# Patient Record
Sex: Female | Born: 1980 | Hispanic: No | Marital: Married | Smoking: Former smoker
Health system: Southern US, Community
[De-identification: ages and names within clinical notes are randomized; demographics above are authoritative.]

## PROBLEM LIST (undated history)

## (undated) DIAGNOSIS — N200 Calculus of kidney: Secondary | ICD-10-CM

## (undated) DIAGNOSIS — Z8619 Personal history of other infectious and parasitic diseases: Secondary | ICD-10-CM

## (undated) HISTORY — DX: Personal history of other infectious and parasitic diseases: Z86.19

## (undated) HISTORY — PX: CHOLECYSTECTOMY: SHX55

---

## 2008-04-15 ENCOUNTER — Encounter: Payer: Self-pay | Admitting: Physician Assistant

## 2008-04-15 ENCOUNTER — Ambulatory Visit: Payer: Self-pay | Admitting: Obstetrics & Gynecology

## 2008-04-15 LAB — CONVERTED CEMR LAB
Basophils Absolute: 0 10*3/uL (ref 0.0–0.1)
Basophils Relative: 0 % (ref 0–1)
Eosinophils Relative: 0 % (ref 0–5)
HCT: 33.8 % — ABNORMAL LOW (ref 36.0–46.0)
Hepatitis B Surface Ag: NEGATIVE
Hgb F Quant: 0 % (ref 0.0–2.0)
Hgb S Quant: 0 % (ref 0.0–0.0)
Lymphs Abs: 2.4 10*3/uL (ref 0.7–4.0)
MCHC: 31.1 g/dL (ref 30.0–36.0)
Monocytes Relative: 8 % (ref 3–12)
Neutro Abs: 9.1 10*3/uL — ABNORMAL HIGH (ref 1.7–7.7)
WBC: 12.6 10*3/uL — ABNORMAL HIGH (ref 4.0–10.5)

## 2008-04-20 ENCOUNTER — Ambulatory Visit (HOSPITAL_COMMUNITY): Admission: RE | Admit: 2008-04-20 | Discharge: 2008-04-20 | Payer: Self-pay | Admitting: Family Medicine

## 2008-04-29 ENCOUNTER — Ambulatory Visit: Payer: Self-pay | Admitting: Obstetrics & Gynecology

## 2008-04-30 ENCOUNTER — Encounter: Payer: Self-pay | Admitting: Family

## 2008-05-13 ENCOUNTER — Ambulatory Visit: Payer: Self-pay | Admitting: Family Medicine

## 2008-05-13 ENCOUNTER — Ambulatory Visit (HOSPITAL_COMMUNITY): Admission: RE | Admit: 2008-05-13 | Discharge: 2008-05-13 | Payer: Self-pay | Admitting: Obstetrics & Gynecology

## 2008-05-13 ENCOUNTER — Encounter: Payer: Self-pay | Admitting: Family

## 2008-05-20 ENCOUNTER — Ambulatory Visit: Payer: Self-pay | Admitting: Family Medicine

## 2008-05-27 ENCOUNTER — Ambulatory Visit: Payer: Self-pay | Admitting: Family Medicine

## 2008-05-27 ENCOUNTER — Encounter: Payer: Self-pay | Admitting: Family

## 2008-06-03 ENCOUNTER — Ambulatory Visit: Payer: Self-pay | Admitting: Family Medicine

## 2008-06-17 ENCOUNTER — Inpatient Hospital Stay (HOSPITAL_COMMUNITY): Admission: AD | Admit: 2008-06-17 | Discharge: 2008-06-18 | Payer: Self-pay | Admitting: Obstetrics & Gynecology

## 2008-06-17 ENCOUNTER — Ambulatory Visit: Payer: Self-pay | Admitting: Family Medicine

## 2008-10-16 ENCOUNTER — Emergency Department (HOSPITAL_COMMUNITY): Admission: EM | Admit: 2008-10-16 | Discharge: 2008-10-16 | Payer: Self-pay | Admitting: Emergency Medicine

## 2009-09-13 ENCOUNTER — Ambulatory Visit (HOSPITAL_COMMUNITY): Admission: RE | Admit: 2009-09-13 | Discharge: 2009-09-13 | Payer: Self-pay | Admitting: Internal Medicine

## 2009-10-28 ENCOUNTER — Encounter (INDEPENDENT_AMBULATORY_CARE_PROVIDER_SITE_OTHER): Payer: Self-pay | Admitting: Surgery

## 2009-10-28 ENCOUNTER — Observation Stay (HOSPITAL_COMMUNITY): Admission: RE | Admit: 2009-10-28 | Discharge: 2009-10-29 | Payer: Self-pay | Admitting: Surgery

## 2010-05-29 ENCOUNTER — Encounter: Payer: Self-pay | Admitting: *Deleted

## 2010-07-24 LAB — COMPREHENSIVE METABOLIC PANEL
AST: 15 U/L (ref 0–37)
BUN: 12 mg/dL (ref 6–23)
Calcium: 9.3 mg/dL (ref 8.4–10.5)
Chloride: 104 mEq/L (ref 96–112)
Creatinine, Ser: 0.54 mg/dL (ref 0.4–1.2)
GFR calc Af Amer: >60 mL/min (ref >60)
Total Bilirubin: 0.9 mg/dL (ref 0.3–1.2)
Total Protein: 7.9 g/dL (ref 6.0–8.3)

## 2010-07-24 LAB — CBC
HCT: 36.6 % (ref 36.0–46.0)
Hemoglobin: 12 g/dL (ref 12.0–15.0)
MCHC: 32.8 g/dL (ref 30.0–36.0)
MCV: 88.8 fL (ref 78.0–100.0)
Platelets: 253 10*3/uL (ref 150–400)
RBC: 4.12 MIL/uL (ref 3.87–5.11)
RDW: 13 % (ref 11.5–15.5)
WBC: 8.9 10*3/uL (ref 4.0–10.5)

## 2010-07-24 LAB — DIFFERENTIAL
Basophils Relative: 0 % (ref 0–1)
Monocytes Absolute: 0.5 10*3/uL (ref 0.1–1.0)
Monocytes Relative: 6 % (ref 3–12)
Neutro Abs: 5.3 10*3/uL (ref 1.7–7.7)
Neutrophils Relative %: 60 % (ref 43–77)

## 2010-07-24 LAB — PREGNANCY, URINE: Preg Test, Ur: NEGATIVE

## 2010-08-06 ENCOUNTER — Emergency Department (HOSPITAL_COMMUNITY): Payer: Medicaid Other

## 2010-08-06 ENCOUNTER — Emergency Department (HOSPITAL_COMMUNITY)
Admission: EM | Admit: 2010-08-06 | Discharge: 2010-08-06 | Disposition: A | Discharge status: Home / Self Care | Payer: Medicaid Other | Attending: Emergency Medicine | Admitting: Emergency Medicine

## 2010-08-06 DIAGNOSIS — R1012 Left upper quadrant pain: Secondary | ICD-10-CM | POA: Insufficient documentation

## 2010-08-06 DIAGNOSIS — R109 Unspecified abdominal pain: Secondary | ICD-10-CM | POA: Insufficient documentation

## 2010-08-06 DIAGNOSIS — M545 Low back pain, unspecified: Secondary | ICD-10-CM | POA: Insufficient documentation

## 2010-08-06 DIAGNOSIS — R112 Nausea with vomiting, unspecified: Secondary | ICD-10-CM | POA: Insufficient documentation

## 2010-08-06 DIAGNOSIS — N200 Calculus of kidney: Secondary | ICD-10-CM | POA: Insufficient documentation

## 2010-08-06 DIAGNOSIS — M62838 Other muscle spasm: Secondary | ICD-10-CM | POA: Insufficient documentation

## 2010-08-06 DIAGNOSIS — N201 Calculus of ureter: Secondary | ICD-10-CM | POA: Insufficient documentation

## 2010-08-06 LAB — URINALYSIS, ROUTINE W REFLEX MICROSCOPIC
Bilirubin Urine: NEGATIVE
Glucose, UA: NEGATIVE mg/dL
Leukocytes, UA: NEGATIVE
Nitrite: NEGATIVE
Protein, ur: NEGATIVE mg/dL
Urobilinogen, UA: 0.2 mg/dL (ref 0.0–1.0)
pH: 5.5 (ref 5.0–8.0)

## 2010-08-06 LAB — CBC
Hemoglobin: 11.5 g/dL — ABNORMAL LOW (ref 12.0–15.0)
MCH: 29.5 pg (ref 26.0–34.0)
MCV: 87.4 fL (ref 78.0–100.0)
RBC: 3.9 MIL/uL (ref 3.87–5.11)
WBC: 6.9 10*3/uL (ref 4.0–10.5)

## 2010-08-06 LAB — DIFFERENTIAL
Basophils Absolute: 0 10*3/uL (ref 0.0–0.1)
Basophils Relative: 1 % (ref 0–1)
Lymphs Abs: 2.5 10*3/uL (ref 0.7–4.0)
Monocytes Relative: 6 % (ref 3–12)
Neutrophils Relative %: 57 % (ref 43–77)

## 2010-08-06 LAB — COMPREHENSIVE METABOLIC PANEL
ALT: 11 U/L (ref 0–35)
Albumin: 3.7 g/dL (ref 3.5–5.2)
BUN: 11 mg/dL (ref 6–23)
CO2: 23 mEq/L (ref 19–32)
Calcium: 8.5 mg/dL (ref 8.4–10.5)
Chloride: 109 mEq/L (ref 96–112)
Creatinine, Ser: 0.54 mg/dL (ref 0.4–1.2)
GFR calc Af Amer: >60 mL/min (ref >60)
Glucose, Bld: 93 mg/dL (ref 70–99)
Total Bilirubin: 0.7 mg/dL (ref 0.3–1.2)

## 2010-08-06 LAB — LIPASE, BLOOD: Lipase: 26 U/L (ref 11–59)

## 2010-08-06 LAB — URINE MICROSCOPIC-ADD ON

## 2010-08-06 LAB — POCT PREGNANCY, URINE: Preg Test, Ur: NEGATIVE

## 2010-08-15 LAB — COMPREHENSIVE METABOLIC PANEL
ALT: 40 U/L — ABNORMAL HIGH (ref 0–35)
AST: 91 U/L — ABNORMAL HIGH (ref 0–37)
Alkaline Phosphatase: 79 U/L (ref 39–117)
Chloride: 104 mEq/L (ref 96–112)
GFR calc Af Amer: >60 mL/min (ref >60)
Glucose, Bld: 110 mg/dL — ABNORMAL HIGH (ref 70–99)
Sodium: 139 mEq/L (ref 135–145)
Total Protein: 7.2 g/dL (ref 6.0–8.3)

## 2010-08-15 LAB — URINALYSIS, ROUTINE W REFLEX MICROSCOPIC
Nitrite: NEGATIVE
Specific Gravity, Urine: 1.042 — ABNORMAL HIGH (ref 1.005–1.030)
Urobilinogen, UA: 1 mg/dL (ref 0.0–1.0)
pH: 5.5 (ref 5.0–8.0)

## 2010-08-15 LAB — CBC
Hemoglobin: 12.2 g/dL (ref 12.0–15.0)
MCHC: 34.6 g/dL (ref 30.0–36.0)

## 2010-08-15 LAB — DIFFERENTIAL
Basophils Absolute: 0 10*3/uL (ref 0.0–0.1)
Eosinophils Relative: 1 % (ref 0–5)
Monocytes Absolute: 1.2 10*3/uL — ABNORMAL HIGH (ref 0.1–1.0)
Monocytes Relative: 11 % (ref 3–12)
Neutro Abs: 7.2 10*3/uL (ref 1.7–7.7)
Neutrophils Relative %: 63 % (ref 43–77)

## 2010-08-15 LAB — URINE MICROSCOPIC-ADD ON

## 2010-08-15 LAB — LIPASE, BLOOD: Lipase: 27 U/L (ref 11–59)

## 2010-08-19 ENCOUNTER — Emergency Department (HOSPITAL_COMMUNITY)
Admission: EM | Admit: 2010-08-19 | Discharge: 2010-08-19 | Disposition: A | Discharge status: Home / Self Care | Payer: Medicaid Other | Attending: Emergency Medicine | Admitting: Emergency Medicine

## 2010-08-19 ENCOUNTER — Encounter (HOSPITAL_COMMUNITY): Payer: Self-pay | Admitting: Radiology

## 2010-08-19 ENCOUNTER — Emergency Department (HOSPITAL_COMMUNITY): Payer: Medicaid Other

## 2010-08-19 DIAGNOSIS — R109 Unspecified abdominal pain: Secondary | ICD-10-CM | POA: Insufficient documentation

## 2010-08-19 DIAGNOSIS — R112 Nausea with vomiting, unspecified: Secondary | ICD-10-CM | POA: Insufficient documentation

## 2010-08-19 DIAGNOSIS — R1033 Periumbilical pain: Secondary | ICD-10-CM | POA: Insufficient documentation

## 2010-08-19 DIAGNOSIS — N200 Calculus of kidney: Secondary | ICD-10-CM | POA: Insufficient documentation

## 2010-08-19 LAB — URINALYSIS, ROUTINE W REFLEX MICROSCOPIC
Bilirubin Urine: NEGATIVE
Specific Gravity, Urine: 1.029 (ref 1.005–1.030)
pH: 5.5 (ref 5.0–8.0)

## 2010-08-19 LAB — COMPREHENSIVE METABOLIC PANEL
ALT: 16 U/L (ref 0–35)
Alkaline Phosphatase: 49 U/L (ref 39–117)
Glucose, Bld: 94 mg/dL (ref 70–99)
Potassium: 3.9 mEq/L (ref 3.5–5.1)
Sodium: 136 mEq/L (ref 135–145)
Total Protein: 7.2 g/dL (ref 6.0–8.3)

## 2010-08-19 LAB — DIFFERENTIAL
Basophils Absolute: 0 10*3/uL (ref 0.0–0.1)
Lymphocytes Relative: 31 % (ref 12–46)
Neutro Abs: 4.6 10*3/uL (ref 1.7–7.7)
Neutrophils Relative %: 59 % (ref 43–77)

## 2010-08-19 LAB — CBC
HCT: 35.9 % — ABNORMAL LOW (ref 36.0–46.0)
Hemoglobin: 11.9 g/dL — ABNORMAL LOW (ref 12.0–15.0)
RDW: 13 % (ref 11.5–15.5)
WBC: 7.7 10*3/uL (ref 4.0–10.5)

## 2010-08-19 LAB — WET PREP, GENITAL: Yeast Wet Prep HPF POC: NONE SEEN

## 2010-08-19 LAB — URINE MICROSCOPIC-ADD ON

## 2010-08-19 MED ORDER — IOHEXOL 300 MG/ML  SOLN
100.0000 mL | Freq: Once | INTRAMUSCULAR | Status: AC | PRN
  Administered 2010-08-19: 100 mL via INTRAVENOUS

## 2010-08-20 LAB — GC/CHLAMYDIA PROBE AMP, GENITAL
Chlamydia, DNA Probe: NEGATIVE
GC Probe Amp, Genital: NEGATIVE

## 2010-08-22 LAB — POCT URINALYSIS DIP (DEVICE)
Glucose, UA: NEGATIVE mg/dL
Glucose, UA: NEGATIVE mg/dL
Hgb urine dipstick: NEGATIVE
Hgb urine dipstick: NEGATIVE
Ketones, ur: NEGATIVE mg/dL
Ketones, ur: NEGATIVE mg/dL
Ketones, ur: NEGATIVE mg/dL
Specific Gravity, Urine: 1.015 (ref 1.005–1.030)
Specific Gravity, Urine: 1.025 (ref 1.005–1.030)
pH: 5.5 (ref 5.0–8.0)
pH: 6.5 (ref 5.0–8.0)

## 2010-08-23 LAB — CBC
HCT: 34.8 % — ABNORMAL LOW (ref 36.0–46.0)
Hemoglobin: 11.1 g/dL — ABNORMAL LOW (ref 12.0–15.0)
MCHC: 32 g/dL (ref 30.0–36.0)
RBC: 4.14 MIL/uL (ref 3.87–5.11)
RDW: 17 % — ABNORMAL HIGH (ref 11.5–15.5)

## 2011-01-05 ENCOUNTER — Emergency Department (HOSPITAL_COMMUNITY): Payer: Self-pay

## 2011-01-05 ENCOUNTER — Emergency Department (HOSPITAL_COMMUNITY)
Admission: EM | Admit: 2011-01-05 | Discharge: 2011-01-06 | Disposition: A | Discharge status: Home / Self Care | Payer: Self-pay | Attending: Emergency Medicine | Admitting: Emergency Medicine

## 2011-01-05 DIAGNOSIS — T400X1A Poisoning by opium, accidental (unintentional), initial encounter: Secondary | ICD-10-CM | POA: Insufficient documentation

## 2011-01-05 DIAGNOSIS — T40601A Poisoning by unspecified narcotics, accidental (unintentional), initial encounter: Secondary | ICD-10-CM | POA: Insufficient documentation

## 2011-01-05 DIAGNOSIS — R319 Hematuria, unspecified: Secondary | ICD-10-CM | POA: Insufficient documentation

## 2011-01-05 DIAGNOSIS — R4182 Altered mental status, unspecified: Secondary | ICD-10-CM | POA: Insufficient documentation

## 2011-01-05 DIAGNOSIS — N920 Excessive and frequent menstruation with regular cycle: Secondary | ICD-10-CM | POA: Insufficient documentation

## 2011-01-05 DIAGNOSIS — R109 Unspecified abdominal pain: Secondary | ICD-10-CM | POA: Insufficient documentation

## 2011-01-05 DIAGNOSIS — Z87442 Personal history of urinary calculi: Secondary | ICD-10-CM | POA: Insufficient documentation

## 2011-01-05 LAB — URINE MICROSCOPIC-ADD ON

## 2011-01-05 LAB — URINALYSIS, ROUTINE W REFLEX MICROSCOPIC
Glucose, UA: NEGATIVE mg/dL
Protein, ur: 30 mg/dL — AB
pH: 5.5 (ref 5.0–8.0)

## 2011-01-05 LAB — POCT I-STAT, CHEM 8
BUN: 15 mg/dL (ref 6–23)
Creatinine, Ser: 0.6 mg/dL (ref 0.50–1.10)
Glucose, Bld: 91 mg/dL (ref 70–99)
Hemoglobin: 11.9 g/dL — ABNORMAL LOW (ref 12.0–15.0)
Potassium: 3.8 mEq/L (ref 3.5–5.1)
TCO2: 21 mmol/L (ref 0–100)

## 2011-02-10 LAB — POCT URINALYSIS DIP (DEVICE)
Glucose, UA: NEGATIVE mg/dL
Ketones, ur: NEGATIVE mg/dL
Nitrite: NEGATIVE
Nitrite: NEGATIVE
Protein, ur: 30 mg/dL — AB
Protein, ur: NEGATIVE mg/dL
Urobilinogen, UA: 0.2 mg/dL (ref 0.0–1.0)
Urobilinogen, UA: 0.2 mg/dL (ref 0.0–1.0)
pH: 6.5 (ref 5.0–8.0)

## 2011-04-24 ENCOUNTER — Emergency Department (HOSPITAL_COMMUNITY): Payer: Medicaid Other

## 2011-04-24 ENCOUNTER — Emergency Department (HOSPITAL_COMMUNITY)
Admission: EM | Admit: 2011-04-24 | Discharge: 2011-04-24 | Disposition: A | Discharge status: Home / Self Care | Payer: Medicaid Other | Attending: Emergency Medicine | Admitting: Emergency Medicine

## 2011-04-24 ENCOUNTER — Encounter (HOSPITAL_COMMUNITY): Payer: Self-pay | Admitting: Emergency Medicine

## 2011-04-24 DIAGNOSIS — F17200 Nicotine dependence, unspecified, uncomplicated: Secondary | ICD-10-CM | POA: Insufficient documentation

## 2011-04-24 DIAGNOSIS — R112 Nausea with vomiting, unspecified: Secondary | ICD-10-CM | POA: Insufficient documentation

## 2011-04-24 DIAGNOSIS — N201 Calculus of ureter: Secondary | ICD-10-CM

## 2011-04-24 DIAGNOSIS — R1032 Left lower quadrant pain: Secondary | ICD-10-CM | POA: Insufficient documentation

## 2011-04-24 DIAGNOSIS — Z87442 Personal history of urinary calculi: Secondary | ICD-10-CM | POA: Insufficient documentation

## 2011-04-24 LAB — COMPREHENSIVE METABOLIC PANEL
ALT: 7 U/L (ref 0–35)
AST: 12 U/L (ref 0–37)
Albumin: 4 g/dL (ref 3.5–5.2)
Alkaline Phosphatase: 60 U/L (ref 39–117)
BUN: 14 mg/dL (ref 6–23)
CO2: 23 mEq/L (ref 19–32)
Calcium: 8.9 mg/dL (ref 8.4–10.5)
Chloride: 106 mEq/L (ref 96–112)
Creatinine, Ser: 0.6 mg/dL (ref 0.50–1.10)
GFR calc Af Amer: >90 mL/min (ref >90)
GFR calc non Af Amer: >90 mL/min (ref >90)
Glucose, Bld: 89 mg/dL (ref 70–99)
Potassium: 3.5 mEq/L (ref 3.5–5.1)
Sodium: 138 mEq/L (ref 135–145)
Total Bilirubin: 0.7 mg/dL (ref 0.3–1.2)
Total Protein: 7.8 g/dL (ref 6.0–8.3)

## 2011-04-24 LAB — CBC
HCT: 36.9 % (ref 36.0–46.0)
Hemoglobin: 12.4 g/dL (ref 12.0–15.0)
MCH: 29.6 pg (ref 26.0–34.0)
MCHC: 33.6 g/dL (ref 30.0–36.0)
MCV: 88.1 fL (ref 78.0–100.0)
Platelets: 246 10*3/uL (ref 150–400)
RBC: 4.19 MIL/uL (ref 3.87–5.11)
RDW: 12.8 % (ref 11.5–15.5)
WBC: 9.1 10*3/uL (ref 4.0–10.5)

## 2011-04-24 LAB — URINALYSIS, ROUTINE W REFLEX MICROSCOPIC
Bilirubin Urine: NEGATIVE
Glucose, UA: NEGATIVE mg/dL
Ketones, ur: NEGATIVE mg/dL
Nitrite: NEGATIVE
Protein, ur: 30 mg/dL — AB
Specific Gravity, Urine: 1.031 — ABNORMAL HIGH (ref 1.005–1.030)
Urobilinogen, UA: 1 mg/dL (ref 0.0–1.0)
pH: 6 (ref 5.0–8.0)

## 2011-04-24 LAB — DIFFERENTIAL
Basophils Absolute: 0 10*3/uL (ref 0.0–0.1)
Basophils Relative: 0 % (ref 0–1)
Eosinophils Absolute: 0.2 10*3/uL (ref 0.0–0.7)
Eosinophils Relative: 3 % (ref 0–5)
Lymphocytes Relative: 35 % (ref 12–46)
Lymphs Abs: 3.2 10*3/uL (ref 0.7–4.0)
Monocytes Absolute: 0.6 10*3/uL (ref 0.1–1.0)
Monocytes Relative: 7 % (ref 3–12)
Neutro Abs: 5.1 10*3/uL (ref 1.7–7.7)
Neutrophils Relative %: 55 % (ref 43–77)

## 2011-04-24 LAB — URINE MICROSCOPIC-ADD ON

## 2011-04-24 LAB — POCT PREGNANCY, URINE: Preg Test, Ur: NEGATIVE

## 2011-04-24 MED ORDER — ONDANSETRON HCL 4 MG/2ML IJ SOLN
4.0000 mg | Freq: Once | INTRAMUSCULAR | Status: AC
  Administered 2011-04-24: 4 mg via INTRAVENOUS
  Filled 2011-04-24: qty 2

## 2011-04-24 MED ORDER — HYDROMORPHONE HCL PF 1 MG/ML IJ SOLN
1.0000 mg | Freq: Once | INTRAMUSCULAR | Status: AC
  Administered 2011-04-24: 1 mg via INTRAVENOUS
  Filled 2011-04-24: qty 1

## 2011-04-24 MED ORDER — TAMSULOSIN HCL 0.4 MG PO CAPS: 0.4000 mg | ORAL_CAPSULE | Freq: Every day | ORAL | Status: DC

## 2011-04-24 MED ORDER — PROMETHAZINE HCL 25 MG PO TABS: 25.0000 mg | ORAL_TABLET | Freq: Three times a day (TID) | ORAL | Status: DC | PRN

## 2011-04-24 MED ORDER — KETOROLAC TROMETHAMINE 30 MG/ML IJ SOLN
30.0000 mg | Freq: Once | INTRAMUSCULAR | Status: AC
  Administered 2011-04-24: 30 mg via INTRAVENOUS
  Filled 2011-04-24: qty 1

## 2011-04-24 MED ORDER — SODIUM CHLORIDE 0.9 % IV BOLUS (SEPSIS)
1000.0000 mL | Freq: Once | INTRAVENOUS | Status: AC
  Administered 2011-04-24: 1000 mL via INTRAVENOUS

## 2011-04-24 MED ORDER — OXYCODONE-ACETAMINOPHEN 5-325 MG PO TABS: 1.0000 | ORAL_TABLET | Freq: Four times a day (QID) | ORAL | Status: DC | PRN

## 2011-04-24 NOTE — ED Notes (Addendum)
Patient states she woke up this morning to get her kids ready for school and and started to hurt in her LLQ.  Patient states she was not sick when she went to bed and she has not eaten anything different in several days. Patient denies chest pain, SOB or fever. Patient is currently vomiting and stating she is going to die. Patient states some relief after vomiting.

## 2011-04-24 NOTE — ED Notes (Signed)
Pt. Dress in gown .put pt. Monitor.

## 2011-04-24 NOTE — ED Provider Notes (Signed)
Medical screening examination/treatment/procedure(s) were performed by non-physician practitioner and as supervising physician I was immediately available for consultation/collaboration.   Laray Anger, DO 04/24/11 2016

## 2011-04-24 NOTE — ED Notes (Signed)
Patient resting with NAD at this time.  

## 2011-04-24 NOTE — ED Notes (Signed)
Patient resting with NAD at this time. Family at bedside.  

## 2011-04-24 NOTE — ED Provider Notes (Signed)
History     CSN: [id number] Arrival date & time: 04/24/2011  8:54 AM   First MD Initiated Contact with Patient 04/24/11 [phone]      Chief Complaint  Patient presents with  . Abdominal Pain    (Consider location/radiation/quality/duration/timing/severity/associated sxs/prior treatment) HPI Comments: 30 y.o. Female presents to the emergency room c/o 4 hours of severe abdominal pain.  Pt states she awoke at 6am with sharp nonradiating 10/10 LLQ abdominal pain, nausea and vomiting.  Pt denies CP, SOB, diarrhea, constipation, cough, congestion, fever, vaginal discharge, bleeding or odor, urinary frequency, urgency or hematuria.  Pt sexually active in a monogamous relationship.  Pt last pelvic in Jan 1012 and placement of implanon at that time.  PMHx kidney stone 3 mos ago and gallstones with subsequent cholecystectomy.    Patient is a 30 y.o. female presenting with abdominal pain. The history is provided by the patient.  Abdominal Pain The primary symptoms of the illness include abdominal pain, nausea and vomiting. The primary symptoms of the illness do not include fever, shortness of breath, diarrhea, hematemesis or dysuria. The current episode started 3 to 5 hours ago. The onset of the illness was sudden. The problem has been gradually worsening.  The abdominal pain began 3 to 5 hours ago. The pain came on suddenly. The abdominal pain has been gradually worsening since its onset. The abdominal pain is located in the LLQ. The abdominal pain does not radiate. The severity of the abdominal pain is 10/10. The abdominal pain is relieved by nothing.  The patient has not had a change in bowel habit. Significant associated medical issues include gallstones (hx of with cholecystectomy). Significant associated medical issues do not include GERD or inflammatory bowel disease.     History reviewed. No pertinent past medical history.  Past Surgical History  Procedure Date  . Cholecystectomy     No  family history on file.  History  Substance Use Topics  . Smoking status: Current Everyday Smoker -- 0.5 packs/day  . Smokeless tobacco: Not on file  . Alcohol Use:     OB History    Grav Para Term Preterm Abortions TAB SAB Ect Mult Living                  Review of Systems  Constitutional: Negative for fever.  Respiratory: Negative for shortness of breath.   Gastrointestinal: Positive for nausea, vomiting and abdominal pain. Negative for diarrhea and hematemesis.  Genitourinary: Negative for dysuria.   All pertinent positives and negatives in the history of present illness  Allergies  Review of patient's allergies indicates no known allergies.  Home Medications   Current Outpatient Rx  Name Route Sig Dispense Refill  . ACETAMINOPHEN 325 MG PO TABS Oral Take 325 mg by mouth every 6 (six) hours as needed. For pain     . IBUPROFEN 100 MG PO TABS Oral Take 200 mg by mouth every 6 (six) hours as needed. For pain       BP 101/72  Pulse 87  Temp(Src) 98.9 F (37.2 C) (Oral)  Resp 16  SpO2 99%  LMP 04/10/2011  Physical Exam  Constitutional: She is oriented to person, place, and time. She appears well-developed and well-nourished.  HENT:  Head: Normocephalic and atraumatic.  Right Ear: External ear normal.  Left Ear: External ear normal.  Nose: Nose normal.  Mouth/Throat: Oropharynx is clear and moist.  Eyes: Conjunctivae are normal. Pupils are equal, round, and reactive to light.  Neck: Normal range  of motion.  Cardiovascular: Normal rate, regular rhythm, normal heart sounds and intact distal pulses.  Exam reveals no gallop and no friction rub.   No murmur heard. Pulmonary/Chest: Effort normal and breath sounds normal. No respiratory distress. She has no wheezes. She has no rales. She exhibits no tenderness.  Abdominal: Soft. She exhibits no distension and no mass. There is no hepatosplenomegaly. There is tenderness in the left lower quadrant. There is guarding. There  is no rigidity, no rebound, no CVA tenderness and negative Murphy's sign.    Lymphadenopathy:    She has no cervical adenopathy.  Neurological: She is alert and oriented to person, place, and time.  Skin: Skin is warm and dry. She is not diaphoretic.  Psychiatric: She has a normal mood and affect. Her behavior is normal. Judgment and thought content normal.    ED Course  Procedures (including critical care time)  The patient is pain-free at this time and does have a urologist who she will be asked to followup with.  She is told to return here for any worsening in her pain or persistent vomiting.          MDM  MDM Reviewed: previous chart Reviewed previous: labs, x-ray and ECG Interpretation: labs, ECG and x-ray     The patient has a ureteral stone.         Carlyle Dolly, PA-C 04/24/11 1455

## 2011-04-24 NOTE — ED Notes (Signed)
Family at bedside. 

## 2011-04-24 NOTE — ED Notes (Signed)
Patient resting with family at bedside with NAD at this time. 

## 2011-04-24 NOTE — ED Notes (Signed)
Patient resting, waiting discharge.

## 2011-04-24 NOTE — ED Notes (Signed)
Pt started having abdonimal pain and vomiting at 6am today.  Pain centralized on left side and is increasing.  Pain to touch.

## 2011-04-27 ENCOUNTER — Encounter (HOSPITAL_COMMUNITY): Payer: Self-pay

## 2011-04-27 ENCOUNTER — Emergency Department (HOSPITAL_COMMUNITY)
Admission: EM | Admit: 2011-04-27 | Discharge: 2011-04-28 | Disposition: A | Discharge status: Home / Self Care | Payer: Medicaid Other | Attending: Emergency Medicine | Admitting: Emergency Medicine

## 2011-04-27 DIAGNOSIS — R630 Anorexia: Secondary | ICD-10-CM | POA: Insufficient documentation

## 2011-04-27 DIAGNOSIS — R3915 Urgency of urination: Secondary | ICD-10-CM | POA: Insufficient documentation

## 2011-04-27 DIAGNOSIS — N201 Calculus of ureter: Secondary | ICD-10-CM | POA: Insufficient documentation

## 2011-04-27 DIAGNOSIS — R000 Tachycardia, unspecified: Secondary | ICD-10-CM | POA: Insufficient documentation

## 2011-04-27 DIAGNOSIS — N200 Calculus of kidney: Secondary | ICD-10-CM

## 2011-04-27 DIAGNOSIS — R112 Nausea with vomiting, unspecified: Secondary | ICD-10-CM | POA: Insufficient documentation

## 2011-04-27 DIAGNOSIS — R109 Unspecified abdominal pain: Secondary | ICD-10-CM | POA: Insufficient documentation

## 2011-04-27 DIAGNOSIS — R350 Frequency of micturition: Secondary | ICD-10-CM | POA: Insufficient documentation

## 2011-04-27 DIAGNOSIS — F17200 Nicotine dependence, unspecified, uncomplicated: Secondary | ICD-10-CM | POA: Insufficient documentation

## 2011-04-27 DIAGNOSIS — R10819 Abdominal tenderness, unspecified site: Secondary | ICD-10-CM | POA: Insufficient documentation

## 2011-04-27 LAB — CBC
HCT: 33.9 % — ABNORMAL LOW (ref 36.0–46.0)
Hemoglobin: 11.2 g/dL — ABNORMAL LOW (ref 12.0–15.0)
MCH: 29.2 pg (ref 26.0–34.0)
MCHC: 33 g/dL (ref 30.0–36.0)
MCV: 88.3 fL (ref 78.0–100.0)
Platelets: 202 10*3/uL (ref 150–400)
RBC: 3.84 MIL/uL — ABNORMAL LOW (ref 3.87–5.11)
RDW: 12.6 % (ref 11.5–15.5)
WBC: 9.9 10*3/uL (ref 4.0–10.5)

## 2011-04-27 LAB — COMPREHENSIVE METABOLIC PANEL
ALT: 7 U/L (ref 0–35)
AST: 11 U/L (ref 0–37)
Albumin: 3.6 g/dL (ref 3.5–5.2)
Alkaline Phosphatase: 48 U/L (ref 39–117)
BUN: 12 mg/dL (ref 6–23)
CO2: 24 mEq/L (ref 19–32)
Calcium: 8.6 mg/dL (ref 8.4–10.5)
Chloride: 104 mEq/L (ref 96–112)
Creatinine, Ser: 0.79 mg/dL (ref 0.50–1.10)
GFR calc Af Amer: >90 mL/min (ref >90)
GFR calc non Af Amer: >90 mL/min (ref >90)
Glucose, Bld: 99 mg/dL (ref 70–99)
Potassium: 3.7 mEq/L (ref 3.5–5.1)
Sodium: 136 mEq/L (ref 135–145)
Total Bilirubin: 0.9 mg/dL (ref 0.3–1.2)
Total Protein: 6.7 g/dL (ref 6.0–8.3)

## 2011-04-27 LAB — URINALYSIS, ROUTINE W REFLEX MICROSCOPIC
Glucose, UA: NEGATIVE mg/dL
Ketones, ur: 15 mg/dL — AB
Nitrite: NEGATIVE
Protein, ur: NEGATIVE mg/dL
Specific Gravity, Urine: 1.027 (ref 1.005–1.030)
Urobilinogen, UA: 1 mg/dL (ref 0.0–1.0)
pH: 6 (ref 5.0–8.0)

## 2011-04-27 LAB — POCT PREGNANCY, URINE: Preg Test, Ur: NEGATIVE

## 2011-04-27 LAB — URINE MICROSCOPIC-ADD ON

## 2011-04-27 LAB — GRAM STAIN

## 2011-04-27 MED ORDER — HYDROMORPHONE HCL PF 1 MG/ML IJ SOLN
1.0000 mg | Freq: Once | INTRAMUSCULAR | Status: AC
  Administered 2011-04-27: 1 mg via INTRAVENOUS
  Filled 2011-04-27: qty 1

## 2011-04-27 MED ORDER — IBUPROFEN 200 MG PO TABS
400.0000 mg | ORAL_TABLET | Freq: Once | ORAL | Status: AC
  Administered 2011-04-27: 400 mg via ORAL
  Filled 2011-04-27: qty 2

## 2011-04-27 MED ORDER — OXYCODONE-ACETAMINOPHEN 5-325 MG PO TABS: 1.0000 | ORAL_TABLET | Freq: Four times a day (QID) | ORAL | Status: AC | PRN

## 2011-04-27 MED ORDER — ONDANSETRON HCL 4 MG/2ML IJ SOLN
4.0000 mg | Freq: Once | INTRAMUSCULAR | Status: AC
  Administered 2011-04-27: 4 mg via INTRAVENOUS
  Filled 2011-04-27 (×2): qty 2

## 2011-04-27 MED ORDER — HYDROMORPHONE HCL PF 1 MG/ML IJ SOLN
1.0000 mg | Freq: Once | INTRAMUSCULAR | Status: AC
  Administered 2011-04-27: 1 mg via INTRAVENOUS
  Filled 2011-04-27 (×2): qty 1

## 2011-04-27 MED ORDER — KETOROLAC TROMETHAMINE 30 MG/ML IJ SOLN
INTRAMUSCULAR | Status: AC
  Administered 2011-04-27: 30 mg
  Filled 2011-04-27: qty 1

## 2011-04-27 MED ORDER — ONDANSETRON HCL 4 MG/2ML IJ SOLN
4.0000 mg | Freq: Once | INTRAMUSCULAR | Status: AC
  Administered 2011-04-27: 4 mg via INTRAVENOUS
  Filled 2011-04-27: qty 2

## 2011-04-27 MED ORDER — MORPHINE SULFATE 4 MG/ML IJ SOLN
4.0000 mg | Freq: Once | INTRAMUSCULAR | Status: AC
  Administered 2011-04-27: 4 mg via INTRAVENOUS
  Filled 2011-04-27: qty 1

## 2011-04-27 MED ORDER — LORAZEPAM 1 MG PO TABS
0.5000 mg | ORAL_TABLET | Freq: Once | ORAL | Status: AC
  Administered 2011-04-27: 0.5 mg via ORAL
  Filled 2011-04-27: qty 1

## 2011-04-27 MED ORDER — ONDANSETRON HCL 4 MG PO TABS: 4.0000 mg | ORAL_TABLET | Freq: Four times a day (QID) | ORAL | Status: AC

## 2011-04-27 MED ORDER — LORAZEPAM 1 MG PO TABS: 1.0000 mg | ORAL_TABLET | Freq: Three times a day (TID) | ORAL | Status: AC | PRN

## 2011-04-27 MED ORDER — SODIUM CHLORIDE 0.9 % IV BOLUS (SEPSIS)
500.0000 mL | Freq: Once | INTRAVENOUS | Status: AC
  Administered 2011-04-28: 500 mL via INTRAVENOUS

## 2011-04-27 MED ORDER — OXYCODONE-ACETAMINOPHEN 5-325 MG PO TABS: 2.0000 | ORAL_TABLET | Freq: Four times a day (QID) | ORAL | Status: AC | PRN

## 2011-04-27 MED ORDER — SODIUM CHLORIDE 0.9 % IV BOLUS (SEPSIS)
1000.0000 mL | Freq: Once | INTRAVENOUS | Status: AC
  Administered 2011-04-27: 1000 mL via INTRAVENOUS

## 2011-04-27 MED ORDER — SODIUM CHLORIDE 0.9 % IV BOLUS (SEPSIS)
1000.0000 mL | Freq: Once | INTRAVENOUS | Status: AC
  Administered 2011-04-27 (×2): 1000 mL via INTRAVENOUS

## 2011-04-27 MED ORDER — ONDANSETRON HCL 4 MG/2ML IJ SOLN
INTRAMUSCULAR | Status: AC
  Filled 2011-04-27: qty 2

## 2011-04-27 MED ORDER — ONDANSETRON 4 MG PO TBDP
4.0000 mg | ORAL_TABLET | Freq: Once | ORAL | Status: AC
  Administered 2011-04-27: 4 mg via ORAL
  Filled 2011-04-27: qty 1

## 2011-04-27 NOTE — ED Notes (Signed)
Pt actively vomiting after zofran odt, notified ERMD, received new orders, hold discharge at this time

## 2011-04-27 NOTE — ED Notes (Signed)
Patient presents with nausea, vomiting and inability to urinate today with LLQ abdominal pain since yesterday.  Patient reports a history of kidney stones and reports this pain feels the same.

## 2011-04-27 NOTE — ED Notes (Signed)
notifed ERMD pt is in pain 10/10 and voided

## 2011-04-27 NOTE — ED Notes (Signed)
Bladder scanner completed post void pt had 94 ml residual present

## 2011-04-27 NOTE — ED Provider Notes (Signed)
I saw and evaluated the patient, reviewed the resident's note and I agree with the findings and plan.  30yF with adbominal/flank pain. Recent eval and had ct significant for 4mm ureteral stone. Persistent pain and feels like can't urinate. Abdominal exam with diffuse L sided tenderness without guarding or rebound. Lungs clear. Heart regular without murmur. Mildy uncomfortable but not toxic. Afebrile and HD stable. UA not suggestive of infection. Renal function normal. Do not feel repeat imaging indicated at this time. Bladder scan with less than post void residual. No evidence of retention. Plan continued pain control and urology fu.  9:14 PM Pt with continued severe pain and vomiting. Will move to CDU for further pain management and observation.  11:35 PM Discussed with pt prior to DC. Symptoms much improved but concerned pain will come back. Explained that will continue to have until stone passes. Pt confirmed does have urology referral, but hasn't arranged fu. Encouraged to do so. Additional scripts given. Return precautions discussed.  Raeford Razor, MD 04/27/11 [phone]

## 2011-04-27 NOTE — ED Notes (Signed)
Report given to Tia, Charity fundraiser. Pt transported to room 20

## 2011-04-27 NOTE — ED Provider Notes (Signed)
History     CSN: [id number]  Arrival date & time 04/27/11  1513   First MD Initiated Contact with Patient 04/27/11 1539      Chief Complaint  Patient presents with  . Abdominal Pain  . Nephrolithiasis    (Consider location/radiation/quality/duration/timing/severity/associated sxs/prior treatment) HPI Comments: Seen here 3 days ago for same pain. Diagnosed with nephrolithiasis at that time. 4 mm partially obstructing calculus in distal L ureter seen with associated mild hydronephrosis.  Patient is a 30 y.o. female presenting with abdominal pain. The history is provided by the patient. No language interpreter was used.  Abdominal Pain The primary symptoms of the illness include abdominal pain, nausea and vomiting. The primary symptoms of the illness do not include fever, shortness of breath, diarrhea, dysuria, vaginal discharge or vaginal bleeding. The current episode started more than 2 days ago. The onset of the illness was gradual. The problem has not changed since onset. The abdominal pain began more than 2 days ago. The pain came on gradually. The abdominal pain has been unchanged since its onset. The abdominal pain is located in the LLQ and LUQ. The abdominal pain does not radiate. The severity of the abdominal pain is 6/10. The abdominal pain is relieved by nothing. The abdominal pain is exacerbated by certain positions.  The vomiting began 2 days ago. Vomiting occurs 2 to 5 times per day. The emesis contains stomach contents and bilious material.  The illness is associated with a recent illness. The patient states that she believes she is currently not pregnant. The patient has not had a change in bowel habit. Additional symptoms associated with the illness include anorexia, urgency and frequency. Symptoms associated with the illness do not include constipation, hematuria or back pain. Associated symptoms comments: Inability to urinate. Significant associated medical issues do not  include gallstones. Associated medical issues comments: Nephrolithiasis.    History reviewed. No pertinent past medical history.  Past Surgical History  Procedure Date  . Cholecystectomy     History reviewed. No pertinent family history.  History  Substance Use Topics  . Smoking status: Current Everyday Smoker -- 0.5 packs/day  . Smokeless tobacco: Not on file  . Alcohol Use: No    OB History    Grav Para Term Preterm Abortions TAB SAB Ect Mult Living                  Review of Systems  Constitutional: Negative for fever.  Respiratory: Negative for shortness of breath.   Gastrointestinal: Positive for nausea, vomiting, abdominal pain and anorexia. Negative for diarrhea and constipation.  Genitourinary: Positive for urgency and frequency. Negative for dysuria, hematuria, vaginal bleeding and vaginal discharge.  Musculoskeletal: Negative for back pain.  All other systems reviewed and are negative.    Allergies  Review of patient's allergies indicates no known allergies.  Home Medications   Current Outpatient Rx  Name Route Sig Dispense Refill  . ACETAMINOPHEN 325 MG PO TABS Oral Take 325 mg by mouth every 6 (six) hours as needed. For pain     . IBUPROFEN 200 MG PO TABS Oral Take 400 mg by mouth every 6 (six) hours as needed. As needed for pain.     . OXYCODONE-ACETAMINOPHEN 5-325 MG PO TABS Oral Take 2 tablets by mouth every 6 (six) hours as needed. As needed for pain.     Marland Kitchen TAMSULOSIN HCL 0.4 MG PO CAPS Oral Take 1 capsule (0.4 mg total) by mouth daily. 4 capsule 0  BP 135/107  Pulse 110  Temp(Src) 98.1 F (36.7 C) (Oral)  Resp 21  SpO2 98%  LMP 04/10/2011  Physical Exam  Constitutional: She is oriented to person, place, and time. She appears well-developed and well-nourished. No distress.  HENT:  Head: Normocephalic and atraumatic.  Eyes: Pupils are equal, round, and reactive to light. Right eye exhibits no discharge. Left eye exhibits no discharge.    Neck: Normal range of motion.  Cardiovascular: Normal heart sounds.  Tachycardia present.  Exam reveals no friction rub.   No murmur heard. Pulmonary/Chest: Effort normal and breath sounds normal. No respiratory distress. She has no wheezes.  Abdominal: Soft. She exhibits no distension. There is tenderness (Diffuse, L side > R side). There is no rebound and no guarding.  Musculoskeletal: Normal range of motion. She exhibits no edema and no tenderness.  Neurological: She is alert and oriented to person, place, and time.  Skin: She is not diaphoretic.    ED Course  Procedures (including critical care time)  Labs Reviewed  CBC - Abnormal; Notable for the following:    RBC 3.84 (*)    Hemoglobin 11.2 (*)    HCT 33.9 (*)    All other components within normal limits  URINALYSIS, ROUTINE W REFLEX MICROSCOPIC - Abnormal; Notable for the following:    APPearance HAZY (*)    Hgb urine dipstick LARGE (*)    Bilirubin Urine SMALL (*)    Ketones, ur 15 (*)    Leukocytes, UA SMALL (*)    All other components within normal limits  URINE MICROSCOPIC-ADD ON - Abnormal; Notable for the following:    Squamous Epithelial / LPF FEW (*)    Bacteria, UA FEW (*)    All other components within normal limits  COMPREHENSIVE METABOLIC PANEL  GRAM STAIN  POCT PREGNANCY, URINE  POCT PREGNANCY, URINE   Gram stain (Final result)   Component (Lab Inquiry)      Result Time  Specimen Description  Special Requests  Gram Stain  Report Status    04/27/11 1809  URINE, CLEAN CATCH  NONE  CYTOSPIN PREP WBC PRESENT,BOTH PMN AND MONONUCLEAR SQUAMOUS EPITHELIAL CELLS PRESENT POSITIVE FOR GRAM POSITIVE RODS  04/27/2011 FINAL         Urine microscopic-add on (Final result)  Abnormal  Component (Lab Inquiry)      Result Time  Squamous Epithelial / LPF  WBC U  RBC / HPF  BACTERIA  Urine-Other    04/27/11 1807  FEW (A)  0-2  7-10  FEW (A)  MUCOUS PRESENT         Urinalysis with microscopic (Final result)   Abnormal  Component (Lab Inquiry)      Result Time  Color, Urine  APPearance  Specific Gravity, Urine  pH  GLUCOSE U    04/27/11 1807  YELLOW  HAZY (A)  1.027  6.0  NEGATIVE           Result Time  Hgb urine dipstick  BILI UR  Ketones, ur  Protein, ur  Urobilinogen, UA    04/27/11 1807  LARGE (A)  SMALL (A)  15 (A)  NEGATIVE  1.0           Result Time  Nitrite  LEUKOCYTES    04/27/11 1807  NEGATIVE  SMALL (A)         Pregnancy, urine POC (Final result)   Component (Lab Inquiry)      Result Time  Preg Test, Ur  04/27/11 1725  NEGATIVE THE SENSITIVITY OF THIS METHODOLOGY IS >24 mIU/mL         Comprehensive metabolic panel (Final result)   Component (Lab Inquiry)      Result Time  NA  K  CL  CO2  GLUCOSE    04/27/11 1646  136  3.7  104  24  99           Result Time  BUN  Creatinine, Ser  CALCIUM  PROTEIN  Albumin    04/27/11 1646  12  0.79  8.6  6.7  3.6           Result Time  AST  ALT  ALK PHOS  BILI TOTL  GFR calc non Af Amer    04/27/11 1646  11  7  48  0.9  >90           Result Time  GFR calc Af Amer    04/27/11 1646  >90 The eGFR has been calculated using the CKD EPI equation. This calculation has not been validated in all clinical situations. eGFR's persistently <90 mL/min signify possible Chronic Kidney Disease.         CBC (Final result)  Abnormal  Component (Lab Inquiry)      Result Time  WBC  RBC  HGB  HCT  MCV    04/27/11 1621  9.9  3.84 (L)  11.2 (L)  33.9 (L)  88.3           Result Time  MCH  MCHC  RDW  PLT    04/27/11 1621  29.2  33.0  12.6  202     No results found.   1. Kidney stones       MDM  20F p/w abdominal pain, vomiting, and urinary retention. Patient seen here 3 days ago for similar and diagnosed with 4mm distal L ureter stone with mild hydronephrosis. No evidence of UTI at that time, sent home with pain meds, flomax, and instructions to f/u with Urology.  Returns for continued abdominal pain and now urinary retention for  past 2 days. Denies fevers. Associated nausea/vomiting. Abdomen soft, diffuse tenderness with L side > R side.  Will repeat labs and do a bladder scan to see if patient is retaining urine. Post-void residual 94cc, no concern for obstruction. No increase in renal function, no elevation in white count. On re-exam, continued pain, dilaudid given.  On re-exam, pain and nausea improving. No evidence of UTI. No need for repeat imaging or acute intervention by Urology. States she has pain and nausea meds at home. Discharged home in stable condition, instructed to f/u with Urology in 2 days.  While awaiting discharge, patient began vomiting again. ODT Zofran did not relieve vomiting. IV placed, fluids, pain and nausea meds given. Patient transferred to CDU to get more pain/nausea medications. Plan to admit if unable to get patient's symptoms controlled.        Elwin Mocha, MD 04/27/11 2135

## 2011-04-28 MED ORDER — ONDANSETRON HCL 4 MG/2ML IJ SOLN
4.0000 mg | Freq: Once | INTRAMUSCULAR | Status: AC
  Administered 2011-04-28: 4 mg via INTRAVENOUS
  Filled 2011-04-28: qty 2

## 2011-04-28 NOTE — ED Notes (Signed)
Pt c/o "freezing" oral temp=98.0 she is shivering and her teeth are chattering.  She also c/o nausea returning and pain returning in a different region than initial assessment.  Initially she stated that her pain was radiating into her left groin.  She now c/o left flank pain with CVA tenderness. Due to low BP pt has been placed in a slight trendelenberg position.  IVF NS 500 cc hung as a bolus.  PA notified of pt status.

## 2011-04-28 NOTE — ED Notes (Signed)
Pt given rx, d/c instructions, urine filter, emesis bags and specimen cup,.  Explained the usage of urine filter and suggested the pt follow a BRAT diet for a few days.  Verbal understanding expressed.  Pt taken to vehicle via w/c.

## 2011-04-28 NOTE — ED Provider Notes (Signed)
I saw and evaluated the patient, reviewed the resident's note and I agree with the findings and plan.    Raeford Razor, MD 04/28/11 [phone]

## 2011-10-29 ENCOUNTER — Encounter (HOSPITAL_COMMUNITY): Payer: Self-pay | Admitting: *Deleted

## 2011-10-29 ENCOUNTER — Emergency Department (HOSPITAL_COMMUNITY)
Admission: EM | Admit: 2011-10-29 | Discharge: 2011-10-29 | Disposition: A | Discharge status: Home / Self Care | Payer: Medicaid Other | Attending: Emergency Medicine | Admitting: Emergency Medicine

## 2011-10-29 DIAGNOSIS — G43909 Migraine, unspecified, not intractable, without status migrainosus: Secondary | ICD-10-CM | POA: Insufficient documentation

## 2011-10-29 MED ORDER — SODIUM CHLORIDE 0.9 % IV SOLN
1000.0000 mL | Freq: Once | INTRAVENOUS | Status: AC
  Administered 2011-10-29: 1000 mL via INTRAVENOUS

## 2011-10-29 MED ORDER — SODIUM CHLORIDE 0.9 % IV SOLN
1000.0000 mL | INTRAVENOUS | Status: DC
  Administered 2011-10-29: 1000 mL via INTRAVENOUS

## 2011-10-29 MED ORDER — METOCLOPRAMIDE HCL 5 MG/ML IJ SOLN
10.0000 mg | Freq: Once | INTRAMUSCULAR | Status: AC
  Administered 2011-10-29: 10 mg via INTRAVENOUS
  Filled 2011-10-29: qty 2

## 2011-10-29 MED ORDER — DIPHENHYDRAMINE HCL 50 MG/ML IJ SOLN
25.0000 mg | Freq: Once | INTRAMUSCULAR | Status: AC
  Administered 2011-10-29: 13:00:00 via INTRAVENOUS
  Filled 2011-10-29: qty 1

## 2011-10-29 MED ORDER — FAMOTIDINE IN NACL 20-0.9 MG/50ML-% IV SOLN
20.0000 mg | Freq: Once | INTRAVENOUS | Status: AC
  Administered 2011-10-29: 20 mg via INTRAVENOUS
  Filled 2011-10-29: qty 50

## 2011-10-29 MED ORDER — KETOROLAC TROMETHAMINE 30 MG/ML IJ SOLN
30.0000 mg | Freq: Once | INTRAMUSCULAR | Status: AC
  Administered 2011-10-29: 30 mg via INTRAVENOUS
  Filled 2011-10-29: qty 1

## 2011-10-29 MED ORDER — SODIUM CHLORIDE 0.9 % IV SOLN: 1000.0000 mL | Freq: Once | INTRAVENOUS | Status: DC

## 2011-10-29 NOTE — Discharge Instructions (Signed)
Nicole Molina you had all the symptoms of a migraine headache today. We gave you a migraine cocktail which has Benadryl, Toradol, and Reglan for nausea. Followup with Dr. Chestine Spore this week if your not better come back to the ER. His medication he can be on that stops the migraine when the symptoms start. Return for any neurological weaknesses or severe pain. We also gave you IV fluids in the ER today. 3 lots of water stay hydrated.

## 2011-10-29 NOTE — ED Provider Notes (Signed)
History     CSN: [id number]  Arrival date & time 10/29/11  1148   First MD Initiated Contact with Patient 10/29/11 1256      Chief Complaint  Patient presents with  . Headache    (Consider location/radiation/quality/duration/timing/severity/associated sxs/prior treatment) Patient is a 31 y.o. female presenting with headaches. The history is provided by the patient. No language interpreter was used.  Headache  This is a new problem. The current episode started yesterday. The problem occurs constantly. The headache is associated with bright light. The pain is at a severity of 10/10. The pain does not radiate. Associated symptoms include nausea and vomiting. Pertinent negatives include no fever, no near-syncope, no syncope and no shortness of breath. She has tried acetaminophen and NSAIDs for the symptoms. The treatment provided no relief.   31 year old coming in with complaint of bilateral frontal headache and chest are today with gradual onset. States that she's taking over-the-counter medications with no relief. She's vomited more than 10 times with this. No history of migraine headaches. Photophobia and phonophobia present. The patient was able to sleep last night but when she woke this morning she did have a small amount of right facial swelling. Slightly tender to touch. Unsure if she slept on her right face.  History reviewed. No pertinent past medical history.  Past Surgical History  Procedure Date  . Cholecystectomy     History reviewed. No pertinent family history.  History  Substance Use Topics  . Smoking status: Current Everyday Smoker -- 0.5 packs/day  . Smokeless tobacco: Not on file  . Alcohol Use: No    OB History    Grav Para Term Preterm Abortions TAB SAB Ect Mult Living                  Review of Systems  Constitutional: Negative.  Negative for fever.  Eyes: Negative.   Respiratory: Negative.  Negative for shortness of breath.   Cardiovascular: Negative.   Negative for syncope and near-syncope.  Gastrointestinal: Positive for nausea and vomiting. Negative for diarrhea.       Epigastric pain  Neurological: Positive for facial asymmetry and headaches. Negative for dizziness, syncope, speech difficulty, weakness, light-headedness and numbness.  Psychiatric/Behavioral: Negative.   All other systems reviewed and are negative.    Allergies  Review of patient's allergies indicates no known allergies.  Home Medications   Current Outpatient Rx  Name Route Sig Dispense Refill  . ASPIRIN-ACETAMINOPHEN-CAFFEINE 250-250-65 MG PO TABS Oral Take 1 tablet by mouth every 6 (six) hours as needed. For pain.      BP 110/74  Pulse 77  Temp 98.9 F (37.2 C) (Oral)  Resp 14  SpO2 99%  LMP 09/22/2011  Physical Exam  Nursing note and vitals reviewed. Constitutional: She is oriented to person, place, and time. She appears well-developed and well-nourished.  HENT:  Head: Normocephalic and atraumatic.  Eyes: Conjunctivae and EOM are normal. Pupils are equal, round, and reactive to light.  Neck: Normal range of motion. Neck supple.  Cardiovascular: Normal rate.   Pulmonary/Chest: Effort normal.  Abdominal: Soft.  Musculoskeletal: Normal range of motion. She exhibits no edema and no tenderness.  Neurological: She is alert and oriented to person, place, and time. She has normal strength and normal reflexes. No cranial nerve deficit or sensory deficit. GCS eye subscore is 4. GCS verbal subscore is 5. GCS motor subscore is 6.  Skin: Skin is warm and dry.  Psychiatric: She has a normal mood and affect.  ED Course  Procedures (including critical care time)  Labs Reviewed - No data to display No results found.   No diagnosis found.    MDM  Migraine h/a with no fever or neck pain. Better after migraine cocktail and pepcid for epigastric pain. Follow up with pcp as soon as possible to get on preventive meds. Return if worse or  weakness.        Remi Haggard, NP 10/30/11 2226

## 2011-10-29 NOTE — ED Notes (Signed)
Reports severe headache since yesterday, no relief with otc meds, having n/v and reports mild facial swelling. No resp distress noted at triage, airway is intact. Denies hx of migraines.

## 2011-11-03 NOTE — ED Provider Notes (Signed)
Medical screening examination/treatment/procedure(s) were performed by non-physician practitioner and as supervising physician I was immediately available for consultation/collaboration.    Nelia Shi, MD 11/03/11 0001

## 2012-04-12 ENCOUNTER — Encounter (HOSPITAL_COMMUNITY): Payer: Self-pay | Admitting: Family Medicine

## 2012-04-12 ENCOUNTER — Emergency Department (HOSPITAL_COMMUNITY)
Admission: EM | Admit: 2012-04-12 | Discharge: 2012-04-12 | Disposition: A | Discharge status: Home Health | Payer: Self-pay | Attending: Emergency Medicine | Admitting: Emergency Medicine

## 2012-04-12 ENCOUNTER — Emergency Department (HOSPITAL_COMMUNITY): Payer: Self-pay

## 2012-04-12 DIAGNOSIS — F17200 Nicotine dependence, unspecified, uncomplicated: Secondary | ICD-10-CM | POA: Insufficient documentation

## 2012-04-12 DIAGNOSIS — IMO0001 Reserved for inherently not codable concepts without codable children: Secondary | ICD-10-CM | POA: Insufficient documentation

## 2012-04-12 DIAGNOSIS — R5383 Other fatigue: Secondary | ICD-10-CM | POA: Insufficient documentation

## 2012-04-12 DIAGNOSIS — Z9089 Acquired absence of other organs: Secondary | ICD-10-CM | POA: Insufficient documentation

## 2012-04-12 DIAGNOSIS — N938 Other specified abnormal uterine and vaginal bleeding: Secondary | ICD-10-CM | POA: Insufficient documentation

## 2012-04-12 DIAGNOSIS — J3489 Other specified disorders of nose and nasal sinuses: Secondary | ICD-10-CM | POA: Insufficient documentation

## 2012-04-12 DIAGNOSIS — R5381 Other malaise: Secondary | ICD-10-CM | POA: Insufficient documentation

## 2012-04-12 DIAGNOSIS — R42 Dizziness and giddiness: Secondary | ICD-10-CM | POA: Insufficient documentation

## 2012-04-12 DIAGNOSIS — N949 Unspecified condition associated with female genital organs and menstrual cycle: Secondary | ICD-10-CM | POA: Insufficient documentation

## 2012-04-12 DIAGNOSIS — Z87442 Personal history of urinary calculi: Secondary | ICD-10-CM | POA: Insufficient documentation

## 2012-04-12 HISTORY — DX: Calculus of kidney: N20.0

## 2012-04-12 LAB — URINALYSIS, ROUTINE W REFLEX MICROSCOPIC
Bilirubin Urine: NEGATIVE
Ketones, ur: NEGATIVE mg/dL
Nitrite: NEGATIVE
Protein, ur: NEGATIVE mg/dL
Specific Gravity, Urine: 1.023 (ref 1.005–1.030)
Urobilinogen, UA: 0.2 mg/dL (ref 0.0–1.0)

## 2012-04-12 LAB — BASIC METABOLIC PANEL
BUN: 11 mg/dL (ref 6–23)
GFR calc Af Amer: >90 mL/min (ref >90)
GFR calc non Af Amer: >90 mL/min (ref >90)
Potassium: 3.9 mEq/L (ref 3.5–5.1)

## 2012-04-12 LAB — CBC WITH DIFFERENTIAL/PLATELET
Basophils Relative: 1 % (ref 0–1)
Eosinophils Absolute: 0.3 10*3/uL (ref 0.0–0.7)
Hemoglobin: 12.5 g/dL (ref 12.0–15.0)
MCH: 29.6 pg (ref 26.0–34.0)
MCHC: 33.6 g/dL (ref 30.0–36.0)
Monocytes Relative: 8 % (ref 3–12)
Neutrophils Relative %: 55 % (ref 43–77)
Platelets: 258 10*3/uL (ref 150–400)

## 2012-04-12 LAB — WET PREP, GENITAL: Trich, Wet Prep: NONE SEEN

## 2012-04-12 LAB — PREGNANCY, URINE: Preg Test, Ur: NEGATIVE

## 2012-04-12 MED ORDER — SODIUM CHLORIDE 0.9 % IV BOLUS (SEPSIS)
1000.0000 mL | Freq: Once | INTRAVENOUS | Status: AC
  Administered 2012-04-12: 1000 mL via INTRAVENOUS

## 2012-04-12 MED ORDER — ONDANSETRON HCL 4 MG/2ML IJ SOLN
4.0000 mg | Freq: Once | INTRAMUSCULAR | Status: AC
  Administered 2012-04-12: 4 mg via INTRAVENOUS
  Filled 2012-04-12: qty 2

## 2012-04-12 MED ORDER — MORPHINE SULFATE 4 MG/ML IJ SOLN
4.0000 mg | Freq: Once | INTRAMUSCULAR | Status: AC
  Administered 2012-04-12: 4 mg via INTRAVENOUS
  Filled 2012-04-12: qty 1

## 2012-04-12 MED ORDER — HYDROMORPHONE HCL PF 1 MG/ML IJ SOLN
1.0000 mg | Freq: Once | INTRAMUSCULAR | Status: AC
  Administered 2012-04-12: 1 mg via INTRAVENOUS
  Filled 2012-04-12: qty 1

## 2012-04-12 MED ORDER — OXYCODONE-ACETAMINOPHEN 5-325 MG PO TABS: 1.0000 | ORAL_TABLET | Freq: Four times a day (QID) | ORAL | Status: DC | PRN

## 2012-04-12 MED ORDER — ONDANSETRON HCL 4 MG PO TABS: 4.0000 mg | ORAL_TABLET | Freq: Four times a day (QID) | ORAL | Status: DC

## 2012-04-12 MED ORDER — OXYCODONE-ACETAMINOPHEN 5-325 MG PO TABS
1.0000 | ORAL_TABLET | Freq: Once | ORAL | Status: AC
  Administered 2012-04-12: 1 via ORAL
  Filled 2012-04-12: qty 1

## 2012-04-12 NOTE — ED Notes (Signed)
Pt to ultrasound

## 2012-04-12 NOTE — ED Provider Notes (Signed)
History     CSN: [id number]  Arrival date & time 04/12/12  0746   First MD Initiated Contact with Patient 04/12/12 0759      Chief Complaint  Patient presents with  . Abdominal Pain    (Consider location/radiation/quality/duration/timing/severity/associated sxs/prior treatment) HPI Comments: Patient with history of kidney stones, cholecystectomy presents with complaint of bilateral lower abdominal pain for the past one day. Pain is described as cramping. It does not feel like her previous kidney stone pain. Patient also states she has had nasal congestion and generalized muscle aches. She states that she feels fatigued and lightheaded with standing. Patient states that she has had vaginal bleeding for approximately one month, heavy, however this improved 2 days ago. She has an implanon in place. Patient states that she has had heavy bleeding over the past year since this was placed however she has not had the time to followup with her GYN. She denies fever, vomiting, chest pain or shortness of breath, urinary symptoms. No skin rashes. Onset gradual. Course is constant. Nothing makes symptoms better or worse.  The history is provided by the patient.    Past Medical History  Diagnosis Date  . Kidney stones     Past Surgical History  Procedure Date  . Cholecystectomy     History reviewed. No pertinent family history.  History  Substance Use Topics  . Smoking status: Current Every Day Smoker -- 0.5 packs/day  . Smokeless tobacco: Not on file  . Alcohol Use: No    OB History    Grav Para Term Preterm Abortions TAB SAB Ect Mult Living                  Review of Systems  Constitutional: Negative for fever.  HENT: Positive for congestion and rhinorrhea. Negative for sore throat.   Eyes: Negative for redness.  Respiratory: Negative for cough.   Cardiovascular: Negative for chest pain.  Gastrointestinal: Negative for nausea, vomiting, abdominal pain and diarrhea.    Genitourinary: Positive for vaginal bleeding. Negative for dysuria, frequency, hematuria and flank pain.  Musculoskeletal: Positive for myalgias.  Skin: Negative for rash.  Neurological: Positive for light-headedness. Negative for headaches.    Allergies  Review of patient's allergies indicates no known allergies.  Home Medications   Current Outpatient Rx  Name  Route  Sig  Dispense  Refill  . ASPIRIN-ACETAMINOPHEN-CAFFEINE 250-250-65 MG PO TABS   Oral   Take 1 tablet by mouth every 6 (six) hours as needed. For pain.           BP 102/74  Pulse 86  Temp 98.2 F (36.8 C) (Oral)  Resp 16  SpO2 100%  LMP 04/11/2012  Physical Exam  Nursing note and vitals reviewed. Constitutional: She appears well-developed and well-nourished.  HENT:  Head: Normocephalic and atraumatic.       Conjunctiva normal.   Eyes: Conjunctivae normal are normal. Right eye exhibits no discharge. Left eye exhibits no discharge.  Neck: Normal range of motion. Neck supple.  Cardiovascular: Normal rate, regular rhythm and normal heart sounds.   Pulmonary/Chest: Effort normal and breath sounds normal.  Abdominal: Soft. Bowel sounds are normal. She exhibits no distension. There is tenderness in the right lower quadrant and suprapubic area. There is no rigidity, no rebound, no guarding, no tenderness at McBurney's point and negative Murphy's sign.    Genitourinary: Vagina normal. Cervix exhibits no motion tenderness, no discharge and no friability. Right adnexum displays tenderness. Right adnexum displays no mass and  no fullness. Left adnexum displays tenderness. Left adnexum displays no mass and no fullness. No erythema or bleeding around the vagina. No vaginal discharge found.  Neurological: She is alert.  Skin: Skin is warm and dry.  Psychiatric: She has a normal mood and affect.    ED Course  Procedures (including critical care time)  Labs Reviewed  WET PREP, GENITAL - Abnormal; Notable for the  following:    Clue Cells Wet Prep HPF POC FEW (*)     All other components within normal limits  CBC WITH DIFFERENTIAL  BASIC METABOLIC PANEL  URINALYSIS, ROUTINE W REFLEX MICROSCOPIC  PREGNANCY, URINE  GC/CHLAMYDIA PROBE AMP  GC/CHLAMYDIA PROBE AMP   US Transvaginal Non-ob  04/12/2012  *RADIOLOGY REPORT*  Clinical Data: Heavy bleeding.  TRANSABDOMINAL AND TRANSVAGINAL ULTRASOUND OF PELVIS Technique:  Both transabdominal and transvaginal ultrasound examinations of the pelvis were performed. Transabdominal technique was performed for global imaging of the pelvis including uterus, ovaries, adnexal regions, and pelvic cul-de-sac.  It was necessary to proceed with endovaginal exam following the transabdominal exam to visualize the endometrium and adnexa.  Comparison:  08/19/2010.  Findings:  Uterus: Retroverted measuring 7.9 x 4.6 x 5.5 cm.  Endometrium: 3.4 mm.  Right ovary:  4.4 x 2.2 x 2.0 cm without dominant mass.  Left ovary: 3.6 x 2.2 x 1.7 cm without dominant mass.  Other findings: Very small amount free fluid.  IMPRESSION: Normal study. No evidence of pelvic mass or other significant abnormality.   Original Report Authenticated By: Lacy Duverney, M.D.    US Pelvis Complete  04/12/2012  *RADIOLOGY REPORT*  Clinical Data: Heavy bleeding.  TRANSABDOMINAL AND TRANSVAGINAL ULTRASOUND OF PELVIS Technique:  Both transabdominal and transvaginal ultrasound examinations of the pelvis were performed. Transabdominal technique was performed for global imaging of the pelvis including uterus, ovaries, adnexal regions, and pelvic cul-de-sac.  It was necessary to proceed with endovaginal exam following the transabdominal exam to visualize the endometrium and adnexa.  Comparison:  08/19/2010.  Findings:  Uterus: Retroverted measuring 7.9 x 4.6 x 5.5 cm.  Endometrium: 3.4 mm.  Right ovary:  4.4 x 2.2 x 2.0 cm without dominant mass.  Left ovary: 3.6 x 2.2 x 1.7 cm without dominant mass.  Other findings: Very small  amount free fluid.  IMPRESSION: Normal study. No evidence of pelvic mass or other significant abnormality.   Original Report Authenticated By: Lacy Duverney, M.D.      1. Dysfunctional uterine bleeding     8:10 AM Patient seen and examined. Work-up initiated.   Vital signs reviewed and are as follows: Filed Vitals:   04/12/12 0757  BP: 102/74  Pulse: 86  Temp: 98.2 F (36.8 C)  Resp: 16   9:06 AM Pelvic exam with nurse tech.   10:42 AM Awaiting urine pregnancy. Spoke with lab.   Pelvic exam had to be redone as wrong GC/chlamydia probe was sent.   Pt informed of results to this point.   1:39 PM [place] reviewed. Pt informed. She is having worsening sharp back pain. Morphine earlier did not help. Vomited x 1.   Patient improved with pain medication. She is eating/drinking in room.   The patient was urged to return to the Emergency Department immediately with worsening of current symptoms, severe bleeding, worsening abdominal pain, persistent vomiting, blood noted in stools, fever, or any other concerns. The patient verbalized understanding.   Urged follow-up with GYN. Patient urged to return with worsening symptoms or other concerns. Patient verbalized understanding and agrees  with plan.   MDM  Dysfunctional uterine bleeding with normal hemoglobin. [place] unconcerning. Abd soft, NT.Labs reassuring. Patient appears well, symptoms controlled in ED. She will follow-up with GYN.         Renne Crigler, [place] 04/12/12 [phone]

## 2012-04-12 NOTE — ED Notes (Signed)
Pt discharged to home with family. NAD.  

## 2012-04-12 NOTE — ED Notes (Signed)
Per pt sts lower abdominal pain radiating into back since last night. sts she has been on her period x 1 month and feel weak and dizzy. Denies N,V,D. sts some burning when she urinates.

## 2012-04-13 NOTE — ED Provider Notes (Signed)
Medical screening examination/treatment/procedure(s) were performed by non-physician practitioner and as supervising physician I was immediately available for consultation/collaboration.   Donta Mcinroy B. Tomas Schamp, MD 04/13/12 0833 

## 2012-04-14 LAB — GC/CHLAMYDIA PROBE AMP: GC Probe RNA: NEGATIVE

## 2014-07-12 ENCOUNTER — Encounter (HOSPITAL_COMMUNITY): Payer: Self-pay

## 2014-07-12 ENCOUNTER — Emergency Department (HOSPITAL_COMMUNITY): Payer: Self-pay

## 2014-07-12 ENCOUNTER — Emergency Department (HOSPITAL_COMMUNITY)
Admission: EM | Admit: 2014-07-12 | Discharge: 2014-07-13 | Disposition: A | Discharge status: Home / Self Care | Payer: Self-pay | Attending: Emergency Medicine | Admitting: Emergency Medicine

## 2014-07-12 ENCOUNTER — Emergency Department (HOSPITAL_COMMUNITY): Payer: Medicaid Other

## 2014-07-12 DIAGNOSIS — R0789 Other chest pain: Secondary | ICD-10-CM | POA: Insufficient documentation

## 2014-07-12 DIAGNOSIS — R109 Unspecified abdominal pain: Secondary | ICD-10-CM

## 2014-07-12 DIAGNOSIS — Z87442 Personal history of urinary calculi: Secondary | ICD-10-CM | POA: Insufficient documentation

## 2014-07-12 DIAGNOSIS — Z3202 Encounter for pregnancy test, result negative: Secondary | ICD-10-CM | POA: Insufficient documentation

## 2014-07-12 DIAGNOSIS — Z9049 Acquired absence of other specified parts of digestive tract: Secondary | ICD-10-CM | POA: Insufficient documentation

## 2014-07-12 DIAGNOSIS — Z79899 Other long term (current) drug therapy: Secondary | ICD-10-CM | POA: Insufficient documentation

## 2014-07-12 DIAGNOSIS — A084 Viral intestinal infection, unspecified: Secondary | ICD-10-CM | POA: Insufficient documentation

## 2014-07-12 LAB — COMPREHENSIVE METABOLIC PANEL
ALBUMIN: 4 g/dL (ref 3.5–5.2)
ALK PHOS: 50 U/L (ref 39–117)
ALT: 16 U/L (ref 0–35)
AST: 20 U/L (ref 0–37)
Anion gap: 8 (ref 5–15)
BUN: 9 mg/dL (ref 6–23)
CHLORIDE: 106 mmol/L (ref 96–112)
CO2: 25 mmol/L (ref 19–32)
CREATININE: 0.66 mg/dL (ref 0.50–1.10)
Calcium: 9.1 mg/dL (ref 8.4–10.5)
GLUCOSE: 93 mg/dL (ref 70–99)
POTASSIUM: 3.3 mmol/L — AB (ref 3.5–5.1)
Sodium: 139 mmol/L (ref 135–145)
Total Bilirubin: 0.9 mg/dL (ref 0.3–1.2)
Total Protein: 7.9 g/dL (ref 6.0–8.3)

## 2014-07-12 LAB — CBC
HEMATOCRIT: 36.2 % (ref 36.0–46.0)
Hemoglobin: 11.8 g/dL — ABNORMAL LOW (ref 12.0–15.0)
MCH: 27.8 pg (ref 26.0–34.0)
MCHC: 32.6 g/dL (ref 30.0–36.0)
MCV: 85.4 fL (ref 78.0–100.0)
PLATELETS: 314 10*3/uL (ref 150–400)
RBC: 4.24 MIL/uL (ref 3.87–5.11)
RDW: 13.3 % (ref 11.5–15.5)
WBC: 10 10*3/uL (ref 4.0–10.5)

## 2014-07-12 LAB — URINALYSIS, ROUTINE W REFLEX MICROSCOPIC
Glucose, UA: NEGATIVE mg/dL
KETONES UR: NEGATIVE mg/dL
LEUKOCYTES UA: NEGATIVE
NITRITE: NEGATIVE
PROTEIN: NEGATIVE mg/dL
Specific Gravity, Urine: 1.027 (ref 1.005–1.030)
Urobilinogen, UA: 1 mg/dL (ref 0.0–1.0)
pH: 5.5 (ref 5.0–8.0)

## 2014-07-12 LAB — URINE MICROSCOPIC-ADD ON

## 2014-07-12 MED ORDER — ONDANSETRON 4 MG PO TBDP
4.0000 mg | ORAL_TABLET | Freq: Once | ORAL | Status: AC
  Administered 2014-07-12: 4 mg via ORAL

## 2014-07-12 MED ORDER — ONDANSETRON 4 MG PO TBDP
ORAL_TABLET | ORAL | Status: AC
  Filled 2014-07-12: qty 1

## 2014-07-12 MED ORDER — SODIUM CHLORIDE 0.9 % IV BOLUS (SEPSIS)
1000.0000 mL | Freq: Once | INTRAVENOUS | Status: AC
  Administered 2014-07-12: 1000 mL via INTRAVENOUS

## 2014-07-12 NOTE — ED Provider Notes (Signed)
CSN: [id number]     Arrival date & time 07/12/14  1902 History   First MD Initiated Contact with Patient 07/12/14 1941     Chief Complaint  Patient presents with  . Chest Pain  . Abdominal Pain     (Consider location/radiation/quality/duration/timing/severity/associated sxs/prior Treatment) HPI Is a 34 year old female with past medical history of kidney stones who presents the ER complaining of bilateral abdominal pain as well as chest pain. Patient states she has had one day of bilateral flank pain which has been mild, and waxing and waning in nature. Patient states her pain feels consistent with previous kidney stones she has expressed the past. Patient states her main concern was that tonight while eating dinner she had a sudden onset of upper left chest pain which she describes as a sharp pain which is worse with inspiration and with movement. She describes some associated shortness of breath. She states the episode of chest pain lasted approximately 30 minutes, had no alleviating factors, was not associated or change with exertion. Patient states the pain subsided spontaneously when she arrived to the emergency room. Patient reports one episode of nonbilious, nonbloody emesis. Patient denies fever, headache, blurred vision, dizziness, weakness, diarrhea, dysuria. Patient states her main concern tonight is her chest discomfort.  Past Medical History  Diagnosis Date  . Kidney stones    Past Surgical History  Procedure Laterality Date  . Cholecystectomy     History reviewed. No pertinent family history. History  Substance Use Topics  . Smoking status: Current Every Day Smoker -- 0.50 packs/day    Types: Cigarettes  . Smokeless tobacco: Not on file  . Alcohol Use: No   OB History    No data available     Review of Systems  Constitutional: Negative for fever.  HENT: Negative for trouble swallowing.   Eyes: Negative for visual disturbance.  Respiratory: Positive for shortness of  breath.   Cardiovascular: Negative for chest pain.  Gastrointestinal: Positive for nausea, vomiting and abdominal pain.  Genitourinary: Negative for dysuria.  Musculoskeletal: Negative for neck pain.       Chest discomfort  Skin: Negative for rash.  Neurological: Negative for dizziness, weakness and numbness.  Psychiatric/Behavioral: Negative.       Allergies  Review of patient's allergies indicates no known allergies.  Home Medications   Prior to Admission medications   Medication Sig Start Date End Date Taking? Authorizing Provider  acetaminophen (TYLENOL) 500 MG tablet Take 1,000 mg by mouth every 6 (six) hours as needed for moderate pain.   Yes Historical Provider, MD  Cholecalciferol (VITAMIN D PO) Take 1,000 mg by mouth daily.   Yes Historical Provider, MD  diphenhydrAMINE (SOMINEX) 25 MG tablet Take 25 mg by mouth at bedtime as needed. For cold symptoms   Yes Historical Provider, MD  ibuprofen (ADVIL,MOTRIN) 200 MG tablet Take 800 mg by mouth every 6 (six) hours as needed. For pain   Yes Historical Provider, MD  ondansetron (ZOFRAN) 4 MG tablet Take 1 tablet (4 mg total) by mouth every 6 (six) hours. Patient not taking: Reported on 07/12/2014 04/12/12   Renne Crigler, PA-C  ondansetron (ZOFRAN) 4 MG tablet Take 1 tablet (4 mg total) by mouth every 8 (eight) hours as needed for nausea or vomiting. 07/13/14   Monte Fantasia, PA-C  oxyCODONE-acetaminophen (PERCOCET/ROXICET) 5-325 MG per tablet Take 1-2 tablets by mouth every 6 (six) hours as needed for pain. Patient not taking: Reported on 07/12/2014 04/12/12   Renne Crigler, PA-C  BP 92/63 mmHg  Pulse 92  Temp(Src) 98.4 F (36.9 C) (Oral)  Resp 16  Ht 5' (1.524 m)  Wt 168 lb (76.204 kg)  BMI 32.81 kg/m2  SpO2 100%  LMP 07/12/2014 Physical Exam  Constitutional: She is oriented to person, place, and time. She appears well-developed and well-nourished. No distress.  HENT:  Head: Normocephalic and atraumatic.  Mouth/Throat:  Oropharynx is clear and moist. No oropharyngeal exudate.  Eyes: Right eye exhibits no discharge. Left eye exhibits no discharge. No scleral icterus.  Neck: Normal range of motion.  Cardiovascular: Normal rate, regular rhythm and normal heart sounds.   No murmur heard. Pulmonary/Chest: Effort normal and breath sounds normal. No respiratory distress.  Abdominal: Soft. There is tenderness in the right upper quadrant and left upper quadrant. There is no rigidity, no guarding, no tenderness at McBurney's point and negative Murphy's sign.    Musculoskeletal: Normal range of motion. She exhibits no edema or tenderness.  Neurological: She is alert and oriented to person, place, and time. No cranial nerve deficit. Coordination normal.  Skin: Skin is warm and dry. No rash noted. She is not diaphoretic.  Psychiatric: She has a normal mood and affect.  Nursing note and vitals reviewed.   ED Course  Procedures (including critical care time) Labs Review Labs Reviewed  CBC - Abnormal; Notable for the following:    Hemoglobin 11.8 (*)    All other components within normal limits  URINALYSIS, ROUTINE W REFLEX MICROSCOPIC - Abnormal; Notable for the following:    Color, Urine AMBER (*)    APPearance CLOUDY (*)    Hgb urine dipstick LARGE (*)    Bilirubin Urine SMALL (*)    All other components within normal limits  COMPREHENSIVE METABOLIC PANEL - Abnormal; Notable for the following:    Potassium 3.3 (*)    All other components within normal limits  URINE MICROSCOPIC-ADD ON  D-DIMER, QUANTITATIVE  BRAIN NATRIURETIC PEPTIDE  I-STAT TROPOININ, ED  POC URINE PREG, ED    Imaging Review Dg Chest 2 View  07/12/2014   CLINICAL DATA:  Chest wall pain, left-sided chest pain  EXAM: CHEST  2 VIEW  COMPARISON:  None.  FINDINGS: The heart size and mediastinal contours are within normal limits. Both lungs are clear. The visualized skeletal structures are unremarkable.  IMPRESSION: No active cardiopulmonary  disease.   Electronically Signed   By: Esperanza Heir M.D.   On: 07/12/2014 21:24   US Renal  07/12/2014   CLINICAL DATA:  Bilateral flank pain.  History of kidney stones.  EXAM: RENAL/URINARY TRACT ULTRASOUND COMPLETE  COMPARISON:  CT 04/24/2011  FINDINGS: Right Kidney:  Length: 12.0 cm. Echogenicity within normal limits. No mass or hydronephrosis visualized. No shadowing stones.  Left Kidney:  Length: 12.7 cm. Echogenicity within normal limits. Within the upper kidney has a 1.9 x 1.2 x 1.6 cm cyst. No solid mass or hydronephrosis visualized. No shadowing stone.  Bladder:  Appears normal for degree of bladder distention. Both ureteral jets are seen.  IMPRESSION: 1. No obstructive uropathy or hydronephrosis.  No shadowing stones. 2. Left renal cyst.   Electronically Signed   By: Rubye Oaks M.D.   On: 07/12/2014 23:43     EKG Interpretation   Date/Time:  Sunday July 12 2014 19:10:42 EST Ventricular Rate:  104 PR Interval:  150 QRS Duration: 74 QT Interval:  326 QTC Calculation: 428 R Axis:   60 Text Interpretation:  Sinus tachycardia Low voltage QRS Borderline ECG  Confirmed  by Lincoln Brighamees, Liz [phone]([id number]) on 07/12/2014 11:01:44 PM      MDM   Final diagnoses:  Chest wall pain  Viral gastroenteritis  Flank pain   Patient with one day of bilateral upper abdominal flank pain. Patient does have mild generalized tenderness in her upper right and left abdomen. Patient is status post cholecystectomy. No concern for gallbladder etiology. Generalized tenderness is mild in nature, no concern for surgical abdomen. With patient stating her pain feels similar to kidney stones in the past and patient having large hematuria, will send for renal ultrasound. Patient is well-appearing and in no acute distress on exam, I offered patient pain medicine, however patient states she feels she does not need any pain medicines this time as her pain is mostly subsided. Patient has mild tachycardia just above the 100  initially with respirations in the 20s, we'll send a d-dimer. Patient's EKG unremarkable for acute injury or ectopy. Troponin negative. Chest x-ray unremarkable for acute pathology.  US renal with impression: 1. No obstructive uropathy or hydronephrosis. No shadowing stones. 2. Left renal cyst.  D-dimer negative. No concern for ACS, no concern for PE or pneumonia. With patient's chest discomfort reproducible in her upper left chest, believe patient's pain is musculoskeletal in nature. On reexamination, patient has no abdominal pain, likely patient may have experienced kidney stones that have recently passed versus a viral gastroenteritis. We'll send patient home with symptomatic therapy. Patient is hemodynamically stable throughout ER stay, mild hypotension noted, patient states her baseline systolic blood pressures at 90. I strongly encouraged patient to follow up with a primary care provider, and discussed return precautions with patient. Patient verbalizes understanding and agreement of this plan. I encouraged patient to call or return to ER should she have any questions or concerns.  BP 92/63 mmHg  Pulse 92  Temp(Src) 98.4 F (36.9 C) (Oral)  Resp 16  Ht 5' (1.524 m)  Wt 168 lb (76.204 kg)  BMI 32.81 kg/m2  SpO2 100%  LMP 07/12/2014  Signed,  Ladona MowJoe Jerusalen Mateja, PA-C 1:28 AM     Monte FantasiaJoseph W Hanin [place], PA-C 07/13/14 0128  Tilden FossaElizabeth Rees, MD 07/13/14 [phone]

## 2014-07-12 NOTE — ED Notes (Signed)
Onset earlier today abd pain on left and right sides; 15 minutes ago pt started having stabbing left sided chest pain and shortness of breath.  Chest pain decreasing but is still constant.  Vomited x 1 on way to ED.  Normal BM today.

## 2014-07-13 LAB — POC URINE PREG, ED: PREG TEST UR: NEGATIVE

## 2014-07-13 LAB — I-STAT TROPONIN, ED: Troponin i, poc: 0 ng/mL (ref 0.00–0.08)

## 2014-07-13 LAB — D-DIMER, QUANTITATIVE (NOT AT ARMC): D DIMER QUANT: 0.31 ug{FEU}/mL (ref 0.00–0.48)

## 2014-07-13 MED ORDER — ONDANSETRON HCL 4 MG PO TABS
4.0000 mg | ORAL_TABLET | Freq: Three times a day (TID) | ORAL | Status: DC | PRN
Start: 2014-07-13 — End: 2014-07-27

## 2014-07-13 NOTE — Discharge Instructions (Signed)
Chest Wall Pain Chest wall pain is pain in or around the bones and muscles of your chest. It may take up to 6 weeks to get better. It may take longer if you must stay physically active in your work and activities.  CAUSES  Chest wall pain may happen on its own. However, it may be caused by:  A viral illness like the flu.  Injury.  Coughing.  Exercise.  Arthritis.  Fibromyalgia.  Shingles. HOME CARE INSTRUCTIONS   Avoid overtiring physical activity. Try not to strain or perform activities that cause pain. This includes any activities using your chest or your abdominal and side muscles, especially if heavy weights are used.  Put ice on the sore area.  Put ice in a plastic bag.  Place a towel between your skin and the bag.  Leave the ice on for 15-20 minutes per hour while awake for the first 2 days.  Only take over-the-counter or prescription medicines for pain, discomfort, or fever as directed by your caregiver. SEEK IMMEDIATE MEDICAL CARE IF:   Your pain increases, or you are very uncomfortable.  You have a fever.  Your chest pain becomes worse.  You have new, unexplained symptoms.  You have nausea or vomiting.  You feel sweaty or lightheaded.  You have a cough with phlegm (sputum), or you cough up blood. MAKE SURE YOU:   Understand these instructions.  Will watch your condition.  Will get help right away if you are not doing well or get worse. Document Released: 04/24/2005 Document Revised: 07/17/2011 Document Reviewed: 12/19/2010 [facility] Patient Information 2015 [place], [place]. This information is not intended to replace advice given to you by your health care provider. Make sure you discuss any questions you have with your health care provider.  Viral Gastroenteritis Viral gastroenteritis is also known as stomach flu. This condition affects the stomach and intestinal tract. It can cause sudden diarrhea and vomiting. The illness typically lasts 3 to 8  days. Most people develop an immune response that eventually gets rid of the virus. While this natural response develops, the virus can make you quite ill. CAUSES  Many different viruses can cause gastroenteritis, such as rotavirus or noroviruses. You can catch one of these viruses by consuming contaminated food or water. You may also catch a virus by sharing utensils or other personal items with an infected person or by touching a contaminated surface. SYMPTOMS  The most common symptoms are diarrhea and vomiting. These problems can cause a severe loss of body fluids (dehydration) and a body salt (electrolyte) imbalance. Other symptoms may include:  Fever.  Headache.  Fatigue.  Abdominal pain. DIAGNOSIS  Your caregiver can usually diagnose viral gastroenteritis based on your symptoms and a physical exam. A stool sample may also be taken to test for the presence of viruses or other infections. TREATMENT  This illness typically goes away on its own. Treatments are aimed at rehydration. The most serious cases of viral gastroenteritis involve vomiting so severely that you are not able to keep fluids down. In these cases, fluids must be given through an intravenous line (IV). HOME CARE INSTRUCTIONS   Drink enough fluids to keep your urine clear or pale yellow. Drink small amounts of fluids frequently and increase the amounts as tolerated.  Ask your caregiver for specific rehydration instructions.  Avoid:  Foods high in sugar.  Alcohol.  Carbonated drinks.  Tobacco.  Juice.  Caffeine drinks.  Extremely hot or cold fluids.  Fatty, greasy foods.  Too much intake of anything at one time.  Dairy products until 24 to 48 hours after diarrhea stops.  You may consume probiotics. Probiotics are active cultures of beneficial bacteria. They may lessen the amount and number of diarrheal stools in adults. Probiotics can be found in yogurt with active cultures and in supplements.  Wash  your hands well to avoid spreading the virus.  Only take over-the-counter or prescription medicines for pain, discomfort, or fever as directed by your caregiver. Do not give aspirin to children. Antidiarrheal medicines are not recommended.  Ask your caregiver if you should continue to take your regular prescribed and over-the-counter medicines.  Keep all follow-up appointments as directed by your caregiver. SEEK IMMEDIATE MEDICAL CARE IF:   You are unable to keep fluids down.  You do not urinate at least once every 6 to 8 hours.  You develop shortness of breath.  You notice blood in your stool or vomit. This may look like coffee grounds.  You have abdominal pain that increases or is concentrated in one small area (localized).  You have persistent vomiting or diarrhea.  You have a fever.  The patient is a child younger than 3 months, and he or she has a fever.  The patient is a child older than 3 months, and he or she has a fever and persistent symptoms.  The patient is a child older than 3 months, and he or she has a fever and symptoms suddenly get worse.  The patient is a baby, and he or she has no tears when crying. MAKE SURE YOU:   Understand these instructions.  Will watch your condition.  Will get help right away if you are not doing well or get worse. Document Released: 04/24/2005 Document Revised: 07/17/2011 Document Reviewed: 02/08/2011 [facility] Patient Information 2015 [place], [place]. This information is not intended to replace advice given to you by your health care provider. Make sure you discuss any questions you have with your health care provider.  Flank Pain Flank pain refers to pain that is located on the side of the body between the upper abdomen and the back. The pain may occur over a short period of time (acute) or may be long-term or reoccurring (chronic). It may be mild or severe. Flank pain can be caused by many things. CAUSES  Some of the more common  causes of flank pain include:  Muscle strains.   Muscle spasms.   A disease of your spine (vertebral disk disease).   A lung infection (pneumonia).   Fluid around your lungs (pulmonary edema).   A kidney infection.   Kidney stones.   A very painful skin rash caused by the chickenpox virus (shingles).   Gallbladder disease.  HOME CARE INSTRUCTIONS  Home care will depend on the cause of your pain. In general,  Rest as directed by your caregiver.  Drink enough fluids to keep your urine clear or pale yellow.  Only take over-the-counter or prescription medicines as directed by your caregiver. Some medicines may help relieve the pain.  Tell your caregiver about any changes in your pain.  Follow up with your caregiver as directed. SEEK IMMEDIATE MEDICAL CARE IF:   Your pain is not controlled with medicine.   You have new or worsening symptoms.  Your pain increases.   You have abdominal pain.   You have shortness of breath.   You have persistent nausea or vomiting.   You have swelling in your abdomen.   You feel faint  or pass out.   You have blood in your urine.  You have a fever or persistent symptoms for more than 2-3 days.  You have a fever and your symptoms suddenly get worse. MAKE SURE YOU:   Understand these instructions.  Will watch your condition.  Will get help right away if you are not doing well or get worse. Document Released: 06/15/2005 Document Revised: 01/17/2012 Document Reviewed: 12/07/2011 [facility] Patient Information 2015 [place]. This information is not intended to replace advice given to you by your health care provider. Make sure you discuss any questions you have with your health care provider.

## 2014-07-13 NOTE — ED Notes (Signed)
Pt A&OX4, ambulatory at d/c with steady gait, NAD 

## 2014-07-21 ENCOUNTER — Emergency Department (INDEPENDENT_AMBULATORY_CARE_PROVIDER_SITE_OTHER)
Admission: EM | Admit: 2014-07-21 | Discharge: 2014-07-21 | Disposition: A | Discharge status: Home / Self Care | Payer: Self-pay | Source: Home / Self Care | Attending: Family Medicine | Admitting: Family Medicine

## 2014-07-21 ENCOUNTER — Encounter (HOSPITAL_COMMUNITY): Payer: Self-pay | Admitting: Emergency Medicine

## 2014-07-21 DIAGNOSIS — R6889 Other general symptoms and signs: Secondary | ICD-10-CM

## 2014-07-21 LAB — POCT URINALYSIS DIP (DEVICE)
GLUCOSE, UA: NEGATIVE mg/dL
Ketones, ur: NEGATIVE mg/dL
Leukocytes, UA: NEGATIVE
Nitrite: NEGATIVE
PROTEIN: 30 mg/dL — AB
Specific Gravity, Urine: >=1.03 (ref 1.005–1.030)
UROBILINOGEN UA: 0.2 mg/dL (ref 0.0–1.0)
pH: 5.5 (ref 5.0–8.0)

## 2014-07-21 LAB — POCT PREGNANCY, URINE: PREG TEST UR: NEGATIVE

## 2014-07-21 NOTE — Discharge Instructions (Signed)
I have had the opportunity to review your recent evaluation along with your vital signs, your exam today and urine studies collected today. While you do still have blood in your urine, this has been present since 2012 and should be further evaluated by either your primary care doctor or a urologist. Your vital signs and exam are normal and reassuring. The results of the studies performed at the ER on 3/6-07/13/2014 were also reassuring. Today, you appear to be medically stable and can continue to pursue further [facility]. Certainly, you should continue to monitor your symptoms closely at home and if anything becomes suddenly worse or severe, you should report to your nearest Emergency Room for urgent re-evaluation. I have discussed your case with the Medical Director here at the clinic this evening and he is also in agreement that you can continue your evaluation with your primary care provider.

## 2014-07-21 NOTE — ED Provider Notes (Signed)
CSN: [id number]     Arrival date & time 07/21/14  1440 History   First MD Initiated Contact with Patient 07/21/14 1704     Chief Complaint  Patient presents with  . Abdominal Pain  . Shortness of Breath  . Palpitations   (Consider location/radiation/quality/duration/timing/severity/associated sxs/prior Treatment) HPI Comments: Is a 34 year old female with past medical history GERD who presents the ER complaining of bilateral upper quadrant abdominal pain as well as bilateral lateral lower chest pain. She reports that her symptoms have been present for two weeks and that she was recently evaluated at [facility] for same.  She describes some associated shortness of breath in the sense that she feels she cannot get a full deep breath of air. Also states she has perceived air hunger when she lies supine. She states she often experiences transient episodes of chest discomfort with emotional stress, particularily when she is in class at school. These last minutes, have no alleviating factors, was not associated or changed with exertion or meal intake.Patient reports episodes of yellow, non-bloody emesis with meal intake over past two weeks with associated 10 lb weight loss. denies fever, headache, blurred vision, dizziness, weakness, diarrhea, dysuria. States her symptoms are in no way any different that they were when she was evaluated in ER on 07/12/2014. 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 States she has also been checking UA dipsticks at school and notes hematuria on her results.  Has a PCP at [facility] but has yet to follow up with her provider. Review of ER records would indicate patient had ECG with mild tachycardia, normal CXR, normal D-dimer, normal renal U/S, normal CBC & CMET and neg UPT.     Past Medical History  Diagnosis Date  . Kidney stones    Past Surgical History  Procedure Laterality Date  . Cholecystectomy      History reviewed. No pertinent family history. History  Substance Use Topics  . Smoking status: Current Every Day Smoker -- 0.50 packs/day    Types: Cigarettes  . Smokeless tobacco: Not on file  . Alcohol Use: No   OB History    No data available     Review of Systems  Constitutional: Positive for appetite change and unexpected weight change.  HENT: Negative.   Eyes: Negative.   Respiratory: Positive for chest tightness and shortness of breath.   Cardiovascular: Positive for chest pain.  Gastrointestinal: Positive for nausea, vomiting and abdominal pain. Negative for diarrhea, constipation and blood in stool.  Genitourinary: Negative.   Musculoskeletal: Negative.   Skin: Negative.   Neurological: Negative for dizziness, seizures, syncope, weakness and light-headedness.    Allergies  Review of patient's allergies indicates no known allergies.  Home Medications   Prior to Admission medications   Medication Sig Start Date End Date Taking? Authorizing Provider  acetaminophen (TYLENOL) 500 MG tablet Take 1,000 mg by mouth every 6 (six) hours as needed for moderate pain.    Historical Provider, MD  Cholecalciferol (VITAMIN D PO) Take 1,000 mg by mouth daily.    Historical Provider, MD  diphenhydrAMINE (SOMINEX) 25 MG tablet Take 25 mg by mouth at bedtime as needed. For cold symptoms    Historical Provider, MD  ibuprofen (ADVIL,MOTRIN) 200 MG tablet Take 800 mg by mouth every 6 (six) hours as needed. For pain    Historical Provider, MD  ondansetron (ZOFRAN) 4 MG tablet Take 1 tablet (4 mg  total) by mouth every 6 (six) hours. Patient not taking: Reported on 07/12/2014 04/12/12   Renne CriglerJoshua Geiple, PA-C  ondansetron (ZOFRAN) 4 MG tablet Take 1 tablet (4 mg total) by mouth every 8 (eight) hours as needed for nausea or vomiting. 07/13/14   Ladona MowJoe Mintz, PA-C  oxyCODONE-acetaminophen (PERCOCET/ROXICET) 5-325 MG per tablet Take 1-2 tablets by mouth every 6 (six) hours as needed for pain. Patient  not taking: Reported on 07/12/2014 04/12/12   Renne CriglerJoshua Geiple, PA-C   BP 98/67 mmHg  Pulse 82  Temp(Src) 98 F (36.7 C) (Oral)  Resp 16  SpO2 98%  LMP 07/12/2014 Physical Exam  Constitutional: She is oriented to person, place, and time. She appears well-developed and well-nourished. No distress.  HENT:  Head: Normocephalic and atraumatic.  Nose: Nose normal.  Mouth/Throat: Oropharynx is clear and moist.  Eyes: Conjunctivae and EOM are normal. Pupils are equal, round, and reactive to light. No scleral icterus.  Neck: Normal range of motion. Neck supple. No thyromegaly present.  Cardiovascular: Normal rate, regular rhythm and normal heart sounds.  Exam reveals no gallop and no friction rub.   No murmur heard. Pulmonary/Chest: Effort normal and breath sounds normal. No respiratory distress. She has no wheezes. She has no rales. She exhibits no tenderness.  Abdominal: Soft. Normal appearance and bowel sounds are normal. She exhibits no distension and no mass. There is no tenderness. There is no rebound, no guarding and no CVA tenderness.  Musculoskeletal: Normal range of motion. She exhibits no edema or tenderness.  Lymphadenopathy:    She has no cervical adenopathy.  Neurological: She is alert and oriented to person, place, and time.  Skin: Skin is warm and dry.  Psychiatric: She has a normal mood and affect. Her behavior is normal.  Nursing note and vitals reviewed.   ED Course  Procedures (including critical care time) Labs Review Labs Reviewed  POCT URINALYSIS DIP (DEVICE) - Abnormal; Notable for the following:    Bilirubin Urine SMALL (*)    Hgb urine dipstick LARGE (*)    Protein, ur 30 (*)    All other components within normal limits  POCT PREGNANCY, URINE    Imaging Review No results found.   MDM   1. Multiple complaints   I have had the opportunity to review your recent evaluation along with your vital signs, your exam today and urine studies collected today. While  you do still have blood in your urine, this has been present since 2012 and should be further evaluated by either your primary care doctor or a urologist. Your vital signs and exam are normal and reassuring. The results of the studies performed at the ER on 3/6-07/13/2014 were also reassuring. Today, you appear to be medically stable and can continue to pursue further evaluation with [facility]. Certainly, you should continue to monitor your symptoms closely at home and if anything becomes suddenly worse or severe, you should report to your nearest Emergency Room for urgent re-evaluation. I have discussed your case with the Medical Director (Dr. Artis FlockKindl) here at the clinic this evening and he is also in agreement that you can continue your evaluation with your primary care provider.    Ria ClockJennifer Lee H Kashawn Manzano, [place] 07/21/14 2052

## 2014-07-21 NOTE — ED Notes (Signed)
C/o lower abdominal pain with vomiting.  States has kidney stones unsure if that's what's causing the problem.  Also is having palpitations "Ive been under a lot of stress".

## 2014-07-27 ENCOUNTER — Encounter (HOSPITAL_COMMUNITY): Payer: Self-pay | Admitting: Emergency Medicine

## 2014-07-27 ENCOUNTER — Emergency Department (HOSPITAL_COMMUNITY): Payer: Self-pay

## 2014-07-27 ENCOUNTER — Emergency Department (HOSPITAL_COMMUNITY)
Admission: EM | Admit: 2014-07-27 | Discharge: 2014-07-27 | Disposition: A | Discharge status: Home / Self Care | Payer: Self-pay | Attending: Emergency Medicine | Admitting: Emergency Medicine

## 2014-07-27 DIAGNOSIS — N200 Calculus of kidney: Secondary | ICD-10-CM | POA: Insufficient documentation

## 2014-07-27 DIAGNOSIS — Z720 Tobacco use: Secondary | ICD-10-CM | POA: Insufficient documentation

## 2014-07-27 DIAGNOSIS — R102 Pelvic and perineal pain: Secondary | ICD-10-CM

## 2014-07-27 DIAGNOSIS — Z79899 Other long term (current) drug therapy: Secondary | ICD-10-CM | POA: Insufficient documentation

## 2014-07-27 DIAGNOSIS — N83519 Torsion of ovary and ovarian pedicle, unspecified side: Secondary | ICD-10-CM

## 2014-07-27 DIAGNOSIS — R109 Unspecified abdominal pain: Secondary | ICD-10-CM

## 2014-07-27 LAB — COMPREHENSIVE METABOLIC PANEL
ALBUMIN: 4.1 g/dL (ref 3.5–5.2)
ALK PHOS: 56 U/L (ref 39–117)
ALT: 14 U/L (ref 0–35)
AST: 20 U/L (ref 0–37)
Anion gap: 10 (ref 5–15)
BILIRUBIN TOTAL: 0.7 mg/dL (ref 0.3–1.2)
BUN: 13 mg/dL (ref 6–23)
CHLORIDE: 109 mmol/L (ref 96–112)
CO2: 21 mmol/L (ref 19–32)
Calcium: 9.5 mg/dL (ref 8.4–10.5)
Creatinine, Ser: 0.58 mg/dL (ref 0.50–1.10)
GFR calc non Af Amer: >90 mL/min (ref >90)
Glucose, Bld: 115 mg/dL — ABNORMAL HIGH (ref 70–99)
Potassium: 3.6 mmol/L (ref 3.5–5.1)
Sodium: 140 mmol/L (ref 135–145)
Total Protein: 7.7 g/dL (ref 6.0–8.3)

## 2014-07-27 LAB — CBC WITH DIFFERENTIAL/PLATELET
BASOS PCT: 0 % (ref 0–1)
Basophils Absolute: 0 10*3/uL (ref 0.0–0.1)
Eosinophils Absolute: 0.1 10*3/uL (ref 0.0–0.7)
Eosinophils Relative: 1 % (ref 0–5)
HEMATOCRIT: 36.4 % (ref 36.0–46.0)
HEMOGLOBIN: 11.8 g/dL — AB (ref 12.0–15.0)
LYMPHS ABS: 2.2 10*3/uL (ref 0.7–4.0)
LYMPHS PCT: 26 % (ref 12–46)
MCH: 28.4 pg (ref 26.0–34.0)
MCHC: 32.4 g/dL (ref 30.0–36.0)
MCV: 87.5 fL (ref 78.0–100.0)
MONOS PCT: 6 % (ref 3–12)
Monocytes Absolute: 0.5 10*3/uL (ref 0.1–1.0)
NEUTROS ABS: 5.7 10*3/uL (ref 1.7–7.7)
NEUTROS PCT: 67 % (ref 43–77)
Platelets: 254 10*3/uL (ref 150–400)
RBC: 4.16 MIL/uL (ref 3.87–5.11)
RDW: 13.3 % (ref 11.5–15.5)
WBC: 8.5 10*3/uL (ref 4.0–10.5)

## 2014-07-27 LAB — URINALYSIS, ROUTINE W REFLEX MICROSCOPIC
GLUCOSE, UA: NEGATIVE mg/dL
KETONES UR: NEGATIVE mg/dL
Nitrite: NEGATIVE
PROTEIN: 100 mg/dL — AB
Specific Gravity, Urine: 1.029 (ref 1.005–1.030)
Urobilinogen, UA: 1 mg/dL (ref 0.0–1.0)
pH: 5.5 (ref 5.0–8.0)

## 2014-07-27 LAB — I-STAT BETA HCG BLOOD, ED (MC, WL, AP ONLY): I-stat hCG, quantitative: <5 m[IU]/mL (ref <5)

## 2014-07-27 LAB — PREGNANCY, URINE: Preg Test, Ur: NEGATIVE

## 2014-07-27 LAB — WET PREP, GENITAL
CLUE CELLS WET PREP: NONE SEEN
Trich, Wet Prep: NONE SEEN

## 2014-07-27 LAB — URINE MICROSCOPIC-ADD ON

## 2014-07-27 LAB — LIPASE, BLOOD: Lipase: 21 U/L (ref 11–59)

## 2014-07-27 MED ORDER — IOHEXOL 300 MG/ML  SOLN
50.0000 mL | Freq: Once | INTRAMUSCULAR | Status: AC | PRN
  Administered 2014-07-27: 50 mL via ORAL

## 2014-07-27 MED ORDER — ONDANSETRON 4 MG PO TBDP: 4.0000 mg | ORAL_TABLET | Freq: Three times a day (TID) | ORAL | Status: DC | PRN

## 2014-07-27 MED ORDER — SODIUM CHLORIDE 0.9 % IV BOLUS (SEPSIS)
1000.0000 mL | Freq: Once | INTRAVENOUS | Status: AC
  Administered 2014-07-27: 1000 mL via INTRAVENOUS

## 2014-07-27 MED ORDER — ONDANSETRON HCL 4 MG/2ML IJ SOLN
4.0000 mg | Freq: Once | INTRAMUSCULAR | Status: AC
  Administered 2014-07-27: 4 mg via INTRAVENOUS
  Filled 2014-07-27: qty 2

## 2014-07-27 MED ORDER — OXYCODONE-ACETAMINOPHEN 5-325 MG PO TABS: 2.0000 | ORAL_TABLET | ORAL | Status: DC | PRN

## 2014-07-27 MED ORDER — TAMSULOSIN HCL 0.4 MG PO CAPS: 0.4000 mg | ORAL_CAPSULE | Freq: Every day | ORAL | Status: DC

## 2014-07-27 MED ORDER — ONDANSETRON 8 MG PO TBDP
8.0000 mg | ORAL_TABLET | ORAL | Status: AC
  Administered 2014-07-27: 8 mg via ORAL
  Filled 2014-07-27: qty 1

## 2014-07-27 MED ORDER — IOHEXOL 300 MG/ML  SOLN
100.0000 mL | Freq: Once | INTRAMUSCULAR | Status: AC | PRN
  Administered 2014-07-27: 100 mL via INTRAVENOUS

## 2014-07-27 NOTE — ED Notes (Signed)
Pt states she has been in and out of hospital for abd pain, states today she has vomited at least 10 times and has severe lower abd pain.

## 2014-07-27 NOTE — ED Provider Notes (Signed)
Pt reevaluated, pain free and no vomitting.  I reviewed ct with pt.Pt has a 5mm stone at UPJ on the right.  Pt advised to follow up with urology.  Pt given rx for zofran odt, Percocet and flomax. Pt advised to return if any problems.  Lonia SkinnerLeslie K HitchitaSofia, PA-C 07/27/14 1728  Tilden FossaElizabeth Rees, MD 07/27/14 [phone]

## 2014-07-27 NOTE — ED Provider Notes (Signed)
CSN: [id number]     Arrival date & time 07/27/14  1141 History   First MD Initiated Contact with Patient 07/27/14 1334     Chief Complaint  Patient presents with  . Abdominal Pain  . Vomiting     (Consider location/radiation/quality/duration/timing/severity/associated sxs/prior Treatment) HPI   Nicole Molina is a 34 y.o. female  with past medical history significant for kidney stones, GERD complaining of severe bilateral lower abdominal pain with multiple episodes of non-bloody, nonbilious, coffee-ground emesis today. Patient states that she's been having severe abdominal pain over the last 2 weeks, she stated initially it was upper and associated with chest pain. She is of the chest pain shortness of breath have resolved. Patient is taking Tylenol and Zofran one time in the morning with little relief. She has an appointment to see her primary care physician this week. She denies diarrhea, abnormal vaginal discharge, dysuria. She notes hematuria starting this morning. She denies fever, chills.   Past Medical History  Diagnosis Date  . Kidney stones    Past Surgical History  Procedure Laterality Date  . Cholecystectomy     No family history on file. History  Substance Use Topics  . Smoking status: Current Every Day Smoker -- 0.50 packs/day    Types: Cigarettes  . Smokeless tobacco: Not on file  . Alcohol Use: No   OB History    No data available     Review of Systems  10 systems reviewed and found to be negative, except as noted in the HPI.   Allergies  Review of patient's allergies indicates no known allergies.  Home Medications   Prior to Admission medications   Medication Sig Start Date End Date Taking? Authorizing Provider  acetaminophen (TYLENOL) 500 MG tablet Take 1,000 mg by mouth every 6 (six) hours as needed for moderate pain.   Yes Historical Provider, MD  Cholecalciferol (VITAMIN D PO) Take 1,000 mg by mouth daily.   Yes Historical Provider, MD  ibuprofen  (ADVIL,MOTRIN) 200 MG tablet Take 800 mg by mouth every 6 (six) hours as needed. For pain   Yes Historical Provider, MD  ondansetron (ZOFRAN) 4 MG tablet Take 1 tablet (4 mg total) by mouth every 8 (eight) hours as needed for nausea or vomiting. 07/13/14  Yes Joe Rexanne Mano, PA-C  ondansetron (ZOFRAN) 4 MG tablet Take 1 tablet (4 mg total) by mouth every 6 (six) hours. Patient not taking: Reported on 07/12/2014 04/12/12   Renne Crigler, PA-C  oxyCODONE-acetaminophen (PERCOCET/ROXICET) 5-325 MG per tablet Take 1-2 tablets by mouth every 6 (six) hours as needed for pain. Patient not taking: Reported on 07/12/2014 04/12/12   Renne Crigler, PA-C   BP 131/87 mmHg  Pulse 93  Temp(Src) 98.3 F (36.8 C) (Oral)  Resp 20  SpO2 100%  LMP 07/12/2014 Physical Exam  Constitutional: She is oriented to person, place, and time. She appears well-developed and well-nourished. No distress.  HENT:  Head: Normocephalic.  Eyes: Conjunctivae and EOM are normal.  Cardiovascular: Normal rate.   Pulmonary/Chest: Effort normal. No stridor.  Abdominal: Soft. Bowel sounds are normal. There is tenderness.  Bilateral lower abdominal pain with no guarding or rebound.  Genitourinary:  Pelvic exam is chaperoned by technician: No rashes or lesion, no abnormal vaginal discharge, no cervical motion or adnexal tenderness to palpation.  Musculoskeletal: Normal range of motion.  Neurological: She is alert and oriented to person, place, and time.  Psychiatric: She has a normal mood and affect.  Nursing note and vitals reviewed.  ED Course  Procedures (including critical care time) Labs Review Labs Reviewed  CBC WITH DIFFERENTIAL/PLATELET - Abnormal; Notable for the following:    Hemoglobin 11.8 (*)    All other components within normal limits  COMPREHENSIVE METABOLIC PANEL - Abnormal; Notable for the following:    Glucose, Bld 115 (*)    All other components within normal limits  WET PREP, GENITAL  LIPASE, BLOOD  PREGNANCY,  URINE  URINALYSIS, ROUTINE W REFLEX MICROSCOPIC  I-STAT BETA HCG BLOOD, ED ([facility], [facility], [facility] ONLY)  GC/CHLAMYDIA PROBE AMP ([place])    Imaging Review No results found.   EKG Interpretation None      MDM   Final diagnoses:  Abdominal pain   Filed Vitals:   07/27/14 1146 07/27/14 1538  BP: 131/87 90/56  Pulse: 93 68  Temp: 98.3 F (36.8 C)   TempSrc: Oral   Resp: 20 18  SpO2: 100% 99%    Medications  iohexol (OMNIPAQUE) 300 MG/ML solution 50 mL (not administered)  ondansetron (ZOFRAN-ODT) disintegrating tablet 8 mg (8 mg Oral Given 07/27/14 1304)  sodium chloride 0.9 % bolus 1,000 mL (0 mLs Intravenous Stopped 07/27/14 1449)  ondansetron (ZOFRAN) injection 4 mg (4 mg Intravenous Given 07/27/14 1349)    Nicole Molina is a pleasant 34 y.o. female presenting with abdominal pain, multiple episodes of vomiting onset several weeks ago. Patient has been seen several times for similar complaints however today she states the abdominal pain is lower. Patient is only taking acetaminophen and Zofran at home, she is getting little relief this however, she is not taking the Zofran appropriately, she vomited once per day in the morning.  Abdominal exam is nonsurgical, pelvic exam with no acute abnormality. Prep, blood work, urinalysis without significant abnormality. Urine culture has been ordered however, with only small leukocytes and 3-6 white blood cells and no urinary tract symptoms I will not treat at this point. Considering patient's multiple visits for similar complaints, I think it is reasonable to obtain ultrasound and CAT scan. Patient agrees with care plan. She will be encouraged to follow-up with primary care and gastroenterology at discharge. Case signed out to PA [place] at shift change. She will also need more aggressive pain control and possibly Phenergan suppository for control of the nausea. I've advised patient to take the Zofran as directed every 8 hours when  necessary.    Nicole Emeryicole Thedore Pickel, PA-C 07/27/14 1604  Toy CookeyMegan Docherty, MD 07/30/14 [phone]

## 2014-07-27 NOTE — Discharge Instructions (Signed)

## 2014-07-28 LAB — GC/CHLAMYDIA PROBE AMP (~~LOC~~) NOT AT ARMC
Chlamydia: NEGATIVE
Neisseria Gonorrhea: NEGATIVE

## 2014-07-28 LAB — URINE CULTURE: Colony Count: 50000

## 2014-08-06 ENCOUNTER — Encounter (HOSPITAL_COMMUNITY): Payer: Self-pay | Admitting: Emergency Medicine

## 2014-08-06 ENCOUNTER — Emergency Department (HOSPITAL_COMMUNITY): Payer: Self-pay

## 2014-08-06 ENCOUNTER — Emergency Department (HOSPITAL_COMMUNITY)
Admission: EM | Admit: 2014-08-06 | Discharge: 2014-08-07 | Disposition: A | Discharge status: Home / Self Care | Payer: Self-pay | Attending: Emergency Medicine | Admitting: Emergency Medicine

## 2014-08-06 DIAGNOSIS — N200 Calculus of kidney: Secondary | ICD-10-CM | POA: Insufficient documentation

## 2014-08-06 DIAGNOSIS — Z791 Long term (current) use of non-steroidal anti-inflammatories (NSAID): Secondary | ICD-10-CM | POA: Insufficient documentation

## 2014-08-06 DIAGNOSIS — Z720 Tobacco use: Secondary | ICD-10-CM | POA: Insufficient documentation

## 2014-08-06 DIAGNOSIS — Z79899 Other long term (current) drug therapy: Secondary | ICD-10-CM | POA: Insufficient documentation

## 2014-08-06 DIAGNOSIS — R197 Diarrhea, unspecified: Secondary | ICD-10-CM | POA: Insufficient documentation

## 2014-08-06 DIAGNOSIS — Z3202 Encounter for pregnancy test, result negative: Secondary | ICD-10-CM | POA: Insufficient documentation

## 2014-08-06 DIAGNOSIS — N132 Hydronephrosis with renal and ureteral calculous obstruction: Secondary | ICD-10-CM

## 2014-08-06 LAB — CBC WITH DIFFERENTIAL/PLATELET
BASOS PCT: 0 % (ref 0–1)
Basophils Absolute: 0 10*3/uL (ref 0.0–0.1)
EOS ABS: 0.2 10*3/uL (ref 0.0–0.7)
EOS PCT: 1 % (ref 0–5)
HEMATOCRIT: 33.6 % — AB (ref 36.0–46.0)
Hemoglobin: 10.9 g/dL — ABNORMAL LOW (ref 12.0–15.0)
Lymphocytes Relative: 19 % (ref 12–46)
Lymphs Abs: 2.1 10*3/uL (ref 0.7–4.0)
MCH: 27.9 pg (ref 26.0–34.0)
MCHC: 32.4 g/dL (ref 30.0–36.0)
MCV: 86.2 fL (ref 78.0–100.0)
Monocytes Absolute: 1.1 10*3/uL — ABNORMAL HIGH (ref 0.1–1.0)
Monocytes Relative: 10 % (ref 3–12)
Neutro Abs: 7.5 10*3/uL (ref 1.7–7.7)
Neutrophils Relative %: 69 % (ref 43–77)
Platelets: 263 10*3/uL (ref 150–400)
RBC: 3.9 MIL/uL (ref 3.87–5.11)
RDW: 13.2 % (ref 11.5–15.5)
WBC: 11 10*3/uL — ABNORMAL HIGH (ref 4.0–10.5)

## 2014-08-06 LAB — COMPREHENSIVE METABOLIC PANEL
ALBUMIN: 3.6 g/dL (ref 3.5–5.2)
ALT: 16 U/L (ref 0–35)
ANION GAP: 10 (ref 5–15)
AST: 20 U/L (ref 0–37)
Alkaline Phosphatase: 54 U/L (ref 39–117)
BUN: 12 mg/dL (ref 6–23)
CALCIUM: 9 mg/dL (ref 8.4–10.5)
CO2: 22 mmol/L (ref 19–32)
Chloride: 106 mmol/L (ref 96–112)
Creatinine, Ser: 0.87 mg/dL (ref 0.50–1.10)
GFR calc Af Amer: >90 mL/min (ref >90)
GFR calc non Af Amer: 87 mL/min — ABNORMAL LOW (ref >90)
GLUCOSE: 103 mg/dL — AB (ref 70–99)
Potassium: 3.7 mmol/L (ref 3.5–5.1)
Sodium: 138 mmol/L (ref 135–145)
TOTAL PROTEIN: 6.9 g/dL (ref 6.0–8.3)
Total Bilirubin: 0.6 mg/dL (ref 0.3–1.2)

## 2014-08-06 LAB — URINALYSIS, ROUTINE W REFLEX MICROSCOPIC
Bilirubin Urine: NEGATIVE
Glucose, UA: NEGATIVE mg/dL
Ketones, ur: NEGATIVE mg/dL
Leukocytes, UA: NEGATIVE
Nitrite: NEGATIVE
Protein, ur: NEGATIVE mg/dL
SPECIFIC GRAVITY, URINE: 1.029 (ref 1.005–1.030)
UROBILINOGEN UA: 0.2 mg/dL (ref 0.0–1.0)
pH: 5.5 (ref 5.0–8.0)

## 2014-08-06 LAB — URINE MICROSCOPIC-ADD ON

## 2014-08-06 LAB — PREGNANCY, URINE: Preg Test, Ur: NEGATIVE

## 2014-08-06 MED ORDER — OXYCODONE-ACETAMINOPHEN 5-325 MG PO TABS: 1.0000 | ORAL_TABLET | Freq: Once | ORAL | Status: DC

## 2014-08-06 MED ORDER — IOHEXOL 300 MG/ML  SOLN
25.0000 mL | Freq: Once | INTRAMUSCULAR | Status: AC | PRN
  Administered 2014-08-06: 25 mL via ORAL

## 2014-08-06 MED ORDER — OXYCODONE-ACETAMINOPHEN 5-325 MG PO TABS
2.0000 | ORAL_TABLET | Freq: Once | ORAL | Status: AC
  Administered 2014-08-06: 2 via ORAL
  Filled 2014-08-06: qty 2

## 2014-08-06 MED ORDER — HYDROMORPHONE HCL 1 MG/ML IJ SOLN
1.0000 mg | Freq: Once | INTRAMUSCULAR | Status: AC
  Administered 2014-08-06: 1 mg via INTRAVENOUS
  Filled 2014-08-06: qty 1

## 2014-08-06 MED ORDER — ONDANSETRON 4 MG PO TBDP
ORAL_TABLET | ORAL | Status: AC
  Filled 2014-08-06: qty 1

## 2014-08-06 MED ORDER — KETOROLAC TROMETHAMINE 30 MG/ML IJ SOLN
30.0000 mg | Freq: Once | INTRAMUSCULAR | Status: AC
  Administered 2014-08-06: 30 mg via INTRAVENOUS
  Filled 2014-08-06: qty 1

## 2014-08-06 MED ORDER — ONDANSETRON HCL 4 MG PO TABS: 4.0000 mg | ORAL_TABLET | Freq: Once | ORAL | Status: DC

## 2014-08-06 MED ORDER — PROMETHAZINE HCL 25 MG/ML IJ SOLN
25.0000 mg | Freq: Once | INTRAMUSCULAR | Status: AC
  Administered 2014-08-06: 25 mg via INTRAVENOUS
  Filled 2014-08-06: qty 1

## 2014-08-06 MED ORDER — ONDANSETRON 4 MG PO TBDP: 8.0000 mg | ORAL_TABLET | Freq: Once | ORAL | Status: DC

## 2014-08-06 MED ORDER — IOHEXOL 300 MG/ML  SOLN
100.0000 mL | Freq: Once | INTRAMUSCULAR | Status: AC | PRN
  Administered 2014-08-06: 100 mL via INTRAVENOUS

## 2014-08-06 MED ORDER — ONDANSETRON 4 MG PO TBDP
4.0000 mg | ORAL_TABLET | Freq: Once | ORAL | Status: AC
  Administered 2014-08-06: 4 mg via ORAL

## 2014-08-06 MED ORDER — PROMETHAZINE HCL 25 MG/ML IJ SOLN
12.5000 mg | Freq: Once | INTRAMUSCULAR | Status: AC
  Administered 2014-08-06: 12.5 mg via INTRAVENOUS
  Filled 2014-08-06: qty 1

## 2014-08-06 NOTE — ED Provider Notes (Signed)
CSN: [id number]     Arrival date & time 08/06/14  1411 History   First MD Initiated Contact with Patient 08/06/14 1550     Chief Complaint  Patient presents with  . Abdominal Pain  . Emesis  . Nausea   Nicole Molina is a 34 y.o. female with a history of kidney stones and cholecystectomy who presents to the emergency department complaining of right lower quadrant abdominal pain associated with nausea and vomiting since 9 AM this morning. The patient reports her pain feels different from her kidney stones. She reports the pain is sharper and constant. She reports constant pain rates it a 10 out of 10 and describes it as sharp and stabbing. She reports her pain is worse with any movement or standing. She has found nothing that makes it better. She reports taking oxycodone without relief today. She reports usually this helps her kidney stones but has not helped her pain today. She reports vomiting 6 times today. She also reports having loose stool today. She also reports feeling like her bladder is full and has only been able to urinate small amounts. She denies any blood in her urine or stool. Her last menstrual cycle was 08/03/2014. The patient denies fevers, hematemesis, hematochezia, hematuria, urinary symptoms, vaginal bleeding, vaginal discharge.   (Consider location/radiation/quality/duration/timing/severity/associated sxs/prior Treatment) HPI  Past Medical History  Diagnosis Date  . Kidney stones    Past Surgical History  Procedure Laterality Date  . Cholecystectomy     No family history on file. History  Substance Use Topics  . Smoking status: Current Every Day Smoker -- 0.50 packs/day    Types: Cigarettes  . Smokeless tobacco: Not on file  . Alcohol Use: No   OB History    No data available     Review of Systems  Constitutional: Negative for fever and chills.  HENT: Negative for congestion, ear pain and sore throat.   Eyes: Negative for pain and visual disturbance.   Respiratory: Negative for cough, shortness of breath and wheezing.   Cardiovascular: Negative for chest pain and palpitations.  Gastrointestinal: Positive for nausea, vomiting, abdominal pain and diarrhea. Negative for blood in stool.  Genitourinary: Positive for decreased urine volume. Negative for dysuria, urgency, frequency, hematuria, flank pain, vaginal bleeding, vaginal discharge and menstrual problem.  Musculoskeletal: Negative for back pain and neck pain.  Skin: Negative for rash.  Neurological: Negative for light-headedness and headaches.      Allergies  Review of patient's allergies indicates no known allergies.  Home Medications   Prior to Admission medications   Medication Sig Start Date End Date Taking? Authorizing Provider  acetaminophen (TYLENOL) 500 MG tablet Take 1,000 mg by mouth every 6 (six) hours as needed for moderate pain.   Yes Historical Provider, MD  Cholecalciferol (VITAMIN D PO) Take 1,000 mg by mouth daily.   Yes Historical Provider, MD  HYDROcodone-acetaminophen (NORCO/VICODIN) 5-325 MG per tablet Take 1-2 tablets by mouth every 4 (four) hours as needed. For breakthrough pain. 08/07/14   Everlene Farrier, PA-C  naproxen (NAPROSYN) 500 MG tablet Take 1 tablet (500 mg total) by mouth 2 (two) times daily with a meal. 08/07/14   Everlene Farrier, PA-C  ondansetron (ZOFRAN ODT) 4 MG disintegrating tablet Take 1 tablet (4 mg total) by mouth every 8 (eight) hours as needed for nausea or vomiting. 08/07/14   Everlene Farrier, PA-C  oxyCODONE-acetaminophen (PERCOCET/ROXICET) 5-325 MG per tablet Take 2 tablets by mouth every 4 (four) hours as needed for severe pain.  Patient not taking: Reported on 08/06/2014 07/27/14   Elson AreasLeslie K Sofia, PA-C  tamsulosin (FLOMAX) 0.4 MG CAPS capsule Take 1 capsule (0.4 mg total) by mouth daily. Patient not taking: Reported on 08/06/2014 07/27/14   Elson AreasLeslie K Sofia, PA-C   BP 109/60 mmHg  Pulse 68  Temp(Src) 98.1 F (36.7 C) (Oral)  Resp 18  Ht 5'  (1.524 m)  Wt 168 lb (76.204 kg)  BMI 32.81 kg/m2  SpO2 98%  LMP 08/05/2014 Physical Exam  Constitutional: She appears well-developed and well-nourished. No distress.  Nontoxic appearing. She appears uncomfortable.  HENT:  Head: Normocephalic and atraumatic.  Mouth/Throat: Oropharynx is clear and moist. No oropharyngeal exudate.  Eyes: Conjunctivae are normal. Pupils are equal, round, and reactive to light. Right eye exhibits no discharge. Left eye exhibits no discharge.  Neck: Neck supple.  Cardiovascular: Normal rate, regular rhythm, normal heart sounds and intact distal pulses.  Exam reveals no gallop and no friction rub.   No murmur heard. Pulmonary/Chest: Effort normal and breath sounds normal. No respiratory distress. She has no wheezes. She has no rales.  Abdominal: Soft. Bowel sounds are normal. She exhibits no distension and no mass. There is tenderness. There is rebound and guarding.  Abdomen is soft. Bowel sounds are present. McBurney's point tenderness with rebound tenderness.  Positive Rovsing sign. Negative psoas and obturator sign.   Musculoskeletal: She exhibits no edema.  Lymphadenopathy:    She has no cervical adenopathy.  Neurological: She is alert. Coordination normal.  Skin: Skin is warm and dry. No rash noted. She is not diaphoretic. No erythema. No pallor.  Psychiatric: She has a normal mood and affect. Her behavior is normal.  Nursing note and vitals reviewed.   ED Course  Procedures (including critical care time) Labs Review Labs Reviewed  CBC WITH DIFFERENTIAL/PLATELET - Abnormal; Notable for the following:    WBC 11.0 (*)    Hemoglobin 10.9 (*)    HCT 33.6 (*)    Monocytes Absolute 1.1 (*)    All other components within normal limits  COMPREHENSIVE METABOLIC PANEL - Abnormal; Notable for the following:    Glucose, Bld 103 (*)    GFR calc non Af Amer 87 (*)    All other components within normal limits  URINALYSIS, ROUTINE W REFLEX MICROSCOPIC -  Abnormal; Notable for the following:    Hgb urine dipstick SMALL (*)    All other components within normal limits  URINE MICROSCOPIC-ADD ON - Abnormal; Notable for the following:    Bacteria, UA MANY (*)    All other components within normal limits  URINE CULTURE  PREGNANCY, URINE    Imaging Review Ct Abdomen Pelvis W Contrast  08/06/2014   CLINICAL DATA:  Right lower quadrant abdominal pain and nausea and vomiting since this morning.  EXAM: CT ABDOMEN AND PELVIS WITH CONTRAST  TECHNIQUE: Multidetector CT imaging of the abdomen and pelvis was performed using the standard protocol following bolus administration of intravenous contrast.  CONTRAST:  100mL OMNIPAQUE IOHEXOL 300 MG/ML  SOLN  COMPARISON:  07/27/2014  FINDINGS: Lower chest: The lung bases demonstrate dependent bibasilar atelectasis/ edema. No pleural effusion. The heart is normal in size. No pericardial effusion. The distal esophagus is grossly normal.  Hepatobiliary: No focal hepatic lesions or intrahepatic biliary dilatation. The gallbladder is surgically absent. No common bowel duct dilatation.  Pancreas: Normal. No inflammatory changes, mass lesions or ductal dilatation.  Spleen: Normal size.  No focal lesions.  Adrenals/Urinary Tract: The adrenal glands are normal.  The left kidney demonstrates a simple appearing upper pole cyst. No left-sided renal or obstructing ureteral calculi and no renal mass. The right kidney demonstrates a moderate to high-grade obstruction with decreased perfusion, hydronephrosis and perinephric fluid. The right ureter is also dilated down to an obstructing 5 x 4.5 mm right UVJ calculus. This was in the upper ureter on the prior CT scan.  Stomach/Bowel: The stomach, duodenum, small bowel and colon are grossly normal without oral contrast. No inflammatory changes, mass lesions or obstructive findings. The terminal ileum is normal. The appendix is not visualized but I do not see any findings for acute appendicitis.   Vascular/Lymphatic: The aorta and branch vessels are normal. The major venous structures are patent. No mesenteric or retroperitoneal mass or adenopathy.  Other: The uterus and ovaries are unremarkable. The uterus is retroverted. No pelvic mass or adenopathy. No free pelvic fluid collections. No inguinal mass or adenopathy.  Musculoskeletal: No significant bony findings.  IMPRESSION: 1. 5.0 x 4.5 mm right UVJ calculus with moderate to high-grade obstruction. This calculus was in the upper ureter on the prior CT scan. 2. No other significant abdominal/pelvic findings.   Electronically Signed   By: Rudie Meyer M.D.   On: 08/06/2014 20:59     EKG Interpretation None      Filed Vitals:   08/06/14 2300 08/06/14 2315 08/06/14 2330 08/06/14 2345  BP: 106/67 111/63 113/61 109/60  Pulse: 78 66 72 68  Temp:      TempSrc:      Resp: 16   18  Height:      Weight:      SpO2: 98% 98% 98% 98%     MDM   Meds given in ED:  Medications  oxyCODONE-acetaminophen (PERCOCET/ROXICET) 5-325 MG per tablet 2 tablet (2 tablets Oral Given 08/06/14 1513)  ondansetron (ZOFRAN-ODT) disintegrating tablet 4 mg (4 mg Oral Given 08/06/14 1515)  ondansetron (ZOFRAN-ODT) 4 MG disintegrating tablet (  Duplicate 08/06/14 1520)  HYDROmorphone (DILAUDID) injection 1 mg (1 mg Intravenous Given 08/06/14 1739)  promethazine (PHENERGAN) injection 25 mg (25 mg Intravenous Given 08/06/14 1740)  iohexol (OMNIPAQUE) 300 MG/ML solution 25 mL (25 mLs Oral Contrast Given 08/06/14 1723)  iohexol (OMNIPAQUE) 300 MG/ML solution 100 mL (100 mLs Intravenous Contrast Given 08/06/14 2033)  ketorolac (TORADOL) 30 MG/ML injection 30 mg (30 mg Intravenous Given 08/06/14 2146)  promethazine (PHENERGAN) injection 12.5 mg (12.5 mg Intravenous Given 08/06/14 2146)    Discharge Medication List as of 08/07/2014 12:08 AM    START taking these medications   Details  HYDROcodone-acetaminophen (NORCO/VICODIN) 5-325 MG per tablet Take 1-2 tablets by mouth  every 4 (four) hours as needed. For breakthrough pain., Starting 08/07/2014, Until Discontinued, Print    naproxen (NAPROSYN) 500 MG tablet Take 1 tablet (500 mg total) by mouth 2 (two) times daily with a meal., Starting 08/07/2014, Until Discontinued, Print        Final diagnoses:  Kidney stone  Hydronephrosis with urinary obstruction due to renal calculus   This is a 34 y.o. female with a history of kidney stones and cholecystectomy who presents to the emergency department complaining of right lower quadrant abdominal pain associated with nausea and vomiting since 9 AM this morning. The patient reports her pain feels different from her kidney stones. She reports the pain is sharper and constant. The patient is afebrile and nontoxic-appearing. On exam she has right lower quadrant tenderness palpation. She is rebound tenderness. She has positive Rovsing sign. Will obtain CT  scan with contrast to rule out appendicitis. Urinalysis is negative for infection. It does show small hemoglobin. She has a negative urine pregnancy test. CBC is unremarkable. CMP is unremarkable. CT scan shows a 5 x 4.5 mm right UVJ calculus with moderate high-grade obstruction. This calculus was in the upper ureter on the prior CT scan. No appendicitis on CT scan.  At secondary evaluation the patient reports some return of her pain. Patient given Toradol and Phenergan. At reevaluation patient reports her pain is completely resolved. She is able to tolerate water in the ED.  Consulted with Urologist Dr. Vernie Ammons about her findings as she has been seen for the same stone in 10 days. He agrees to see her with close follow up if she calls his office tomorrow. Will discharge the patient with Zofran, naproxen and norco for breakthrough pain. I advised the patient to follow-up with their primary care provider and urology this week. I advised the patient to return to the emergency department with new or worsening symptoms or new concerns. The  patient verbalized understanding and agreement with plan.   This patient was discussed with Dr. Deretha Emory who agrees with assessment and plan.    Everlene Farrier, PA-C 08/07/14 0040  Vanetta Mulders, MD 08/11/14 [phone]

## 2014-08-06 NOTE — ED Notes (Signed)
70cc shown on Bladder Scanner.

## 2014-08-06 NOTE — ED Notes (Signed)
Patient states RLQ pain with nausea and vomiting that started this morning.   Patient states took ibuprofen at 0800 and tylenol at 1000 and aleve at 1300 to help with pain, but no relief.   Patient she was diagnosed with kidney stones x 3 weeks ago, has been seeing "some black spots in the strainer".  Patient states she has not been able to urinate since 1100 today.

## 2014-08-07 MED ORDER — ONDANSETRON 4 MG PO TBDP: 4.0000 mg | ORAL_TABLET | Freq: Three times a day (TID) | ORAL | Status: DC | PRN

## 2014-08-07 MED ORDER — NAPROXEN 500 MG PO TABS: 500.0000 mg | ORAL_TABLET | Freq: Two times a day (BID) | ORAL | Status: DC

## 2014-08-07 MED ORDER — HYDROCODONE-ACETAMINOPHEN 5-325 MG PO TABS: 1.0000 | ORAL_TABLET | ORAL | Status: DC | PRN

## 2014-08-07 NOTE — Discharge Instructions (Signed)
Kidney Stones °Kidney stones (urolithiasis) are deposits that form inside your kidneys. The intense pain is caused by the stone moving through the urinary tract. When the stone moves, the ureter goes into spasm around the stone. The stone is usually passed in the urine.  °CAUSES  °· A disorder that makes certain neck glands produce too much parathyroid hormone (primary hyperparathyroidism). °· A buildup of uric acid crystals, similar to gout in your joints. °· Narrowing (stricture) of the ureter. °· A kidney obstruction present at birth (congenital obstruction). °· Previous surgery on the kidney or ureters. °· Numerous kidney infections. °SYMPTOMS  °· Feeling sick to your stomach (nauseous). °· Throwing up (vomiting). °· Blood in the urine (hematuria). °· Pain that usually spreads (radiates) to the groin. °· Frequency or urgency of urination. °DIAGNOSIS  °· Taking a history and physical exam. °· Blood or urine tests. °· CT scan. °· Occasionally, an examination of the inside of the urinary bladder (cystoscopy) is performed. °TREATMENT  °· Observation. °· Increasing your fluid intake. °· Extracorporeal shock wave lithotripsy--This is a noninvasive procedure that uses shock waves to break up kidney stones. °· Surgery may be needed if you have severe pain or persistent obstruction. There are various surgical procedures. Most of the procedures are performed with the use of small instruments. Only small incisions are needed to accommodate these instruments, so recovery time is minimized. °The size, location, and chemical composition are all important variables that will determine the proper choice of action for you. Talk to your health care provider to better understand your situation so that you will minimize the risk of injury to yourself and your kidney.  °HOME CARE INSTRUCTIONS  °· Drink enough water and fluids to keep your urine clear or pale yellow. This will help you to pass the stone or stone fragments. °· Strain  all urine through the provided strainer. Keep all particulate matter and stones for your health care provider to see. The stone causing the pain may be as small as a grain of salt. It is very important to use the strainer each and every time you pass your urine. The collection of your stone will allow your health care provider to analyze it and verify that a stone has actually passed. The stone analysis will often identify what you can do to reduce the incidence of recurrences. °· Only take over-the-counter or prescription medicines for pain, discomfort, or fever as directed by your health care provider. °· Make a follow-up appointment with your health care provider as directed. °· Get follow-up X-rays if required. The absence of pain does not always mean that the stone has passed. It may have only stopped moving. If the urine remains completely obstructed, it can cause loss of kidney function or even complete destruction of the kidney. It is your responsibility to make sure X-rays and follow-ups are completed. Ultrasounds of the kidney can show blockages and the status of the kidney. Ultrasounds are not associated with any radiation and can be performed easily in a matter of minutes. °SEEK MEDICAL CARE IF: °· You experience pain that is progressive and unresponsive to any pain medicine you have been prescribed. °SEEK IMMEDIATE MEDICAL CARE IF:  °· Pain cannot be controlled with the prescribed medicine. °· You have a fever or shaking chills. °· The severity or intensity of pain increases over 18 hours and is not relieved by pain medicine. °· You develop a new onset of abdominal pain. °· You feel faint or pass out. °·   You are unable to urinate. °MAKE SURE YOU:  °· Understand these instructions. °· Will watch your condition. °· Will get help right away if you are not doing well or get worse. °Document Released: 04/24/2005 Document Revised: 12/25/2012 Document Reviewed: 09/25/2012 °[employer]® Patient Information ©2015  [employer]. This information is not intended to replace advice given to you by your health care provider. Make sure you discuss any questions you have with your health care provider. ° ° ° ° °Hydronephrosis °Hydronephrosis is an abnormal enlargement of your kidney. It can affect one or both the kidneys. It results from the backward pressure of urine on the kidneys, when the flow of urine is blocked. Normally, the urine drains from the kidney through the urine tube (ureter), into a sac which holds the urine until urination (bladder). When the urinary flow is blocked, the urine collects above the block. This causes an increase in the pressure inside the kidney, which in turn leads to its enlargement. The block can occur at the point where the kidney joins the ureter. Treatment depends on the cause and location of the block.  °CAUSES  °The causes of this condition include: °· Birth defect of the kidney or ureter. °· Kink at the point where the kidney joins the ureter. °· Stones and blood clots in the kidney or ureter. °· Cancer, injury, or infection of the ureter. °· Scar tissue formation. °· Backflow of urine (reflux). °· Cancer of bladder or prostate gland. °· Abnormality of the nerves or muscles of the kidney or ureter. °· Lower part of the ureter protruding into the bladder (ureterocele). °· Abnormal contractions of the bladder. °· Both the kidneys can be affected during pregnancy. This is because the enlarging uterus presses on the ureters and blocks the flow of urine. °SYMPTOMS  °The symptoms depend on the location of the block. They also depend on how long the block has been present. You may feel pain on the affected side. Sometimes, you may not have any symptoms. There may be a dull ache or discomfort in the flank. The common symptoms are: °· Flank pain. °· Swelling of the abdomen. °· Pain in the abdomen. °· Nausea and vomiting. °· Fever. °· Pain while passing urine. °· Urgency for urination. °· Frequent or  urgent urination. °· Infection of the urinary tract. °DIAGNOSIS  °Your caregiver will examine you after asking about your symptoms. You may be asked to do blood and urine tests. Your caregiver may order a special X-ray, ultrasound, or CT scan. Sometimes a rigid or flexible telescope (cystoscope) is used to view the site of the blockage.  °TREATMENT  °Treatment depends on the site, cause, and duration of the block. The goal of treatment is to remove the blockage. Your caregiver will plan the treatment based on your condition. The different types of treatment are:  °· Putting in a soft plastic tube (ureteral stent) to connect the bladder with the kidney. This will help in draining the urine. °· Putting in a soft tube (nephrostomy tube). This is placed through skin into the kidney. The trapped urine is drained out through the back. A plastic bag is attached to your skin to hold the urine that has drained out. °· Antibiotics to treat or prevent infection. °· Breaking down of the stone (lithotripsy). °HOME CARE INSTRUCTIONS  °· It may take some time for the hydronephrosis to go away (resolve). Drink fluids as directed by your caregiver , and get a lot of rest. °· If you   have a drain in, your caregiver will give you directions about how to care for it. Be sure you understand these directions completely before you go home. °· Take any antibiotics, pain medications, or other prescriptions exactly as prescribed. °· Follow-up with your caregivers as directed. °SEEK MEDICAL CARE IF:  °· You continue to have flank pain, nausea, or difficulty with urination. °· You have any problem with any type of drainage device. °· Your urine becomes cloudy or bloody. °SEEK IMMEDIATE MEDICAL CARE IF:  °· You have severe flank and/or abdominal pain. °· You develop vomiting and are unable to hold down fluids. °· You develop a fever above 100.5° F (38.1° C), or as per your caregiver. °MAKE SURE YOU:  °· Understand these instructions. °· Will  watch your condition. °· Will get help right away if you are not doing well or get worse. °Document Released: 02/19/2007 Document Revised: 07/17/2011 Document Reviewed: 04/07/2010 °[employer]® Patient Information ©2015 [employer]. This information is not intended to replace advice given to you by your health care provider. Make sure you discuss any questions you have with your health care provider. ° °

## 2014-08-08 LAB — URINE CULTURE

## 2014-12-29 LAB — OB RESULTS CONSOLE ABO/RH: RH TYPE: POSITIVE

## 2014-12-29 LAB — OB RESULTS CONSOLE GC/CHLAMYDIA
Chlamydia: NEGATIVE
Gonorrhea: NEGATIVE

## 2014-12-29 LAB — OB RESULTS CONSOLE ANTIBODY SCREEN: Antibody Screen: NEGATIVE

## 2014-12-29 LAB — OB RESULTS CONSOLE RPR: RPR: NONREACTIVE

## 2014-12-29 LAB — OB RESULTS CONSOLE HEPATITIS B SURFACE ANTIGEN: Hepatitis B Surface Ag: NEGATIVE

## 2014-12-29 LAB — OB RESULTS CONSOLE HIV ANTIBODY (ROUTINE TESTING): HIV: NONREACTIVE

## 2014-12-29 LAB — OB RESULTS CONSOLE RUBELLA ANTIBODY, IGM: Rubella: IMMUNE

## 2015-01-05 ENCOUNTER — Other Ambulatory Visit: Payer: Self-pay | Admitting: Obstetrics and Gynecology

## 2015-01-06 LAB — CYTOLOGY - PAP

## 2015-04-01 ENCOUNTER — Emergency Department (HOSPITAL_COMMUNITY)
Admission: EM | Admit: 2015-04-01 | Discharge: 2015-04-01 | Disposition: A | Discharge status: Home / Self Care | Payer: Medicaid Other | Attending: Emergency Medicine | Admitting: Emergency Medicine

## 2015-04-01 ENCOUNTER — Encounter (HOSPITAL_COMMUNITY): Payer: Self-pay | Admitting: *Deleted

## 2015-04-01 DIAGNOSIS — Z87891 Personal history of nicotine dependence: Secondary | ICD-10-CM | POA: Diagnosis not present

## 2015-04-01 DIAGNOSIS — Y9289 Other specified places as the place of occurrence of the external cause: Secondary | ICD-10-CM | POA: Diagnosis not present

## 2015-04-01 DIAGNOSIS — T148 Other injury of unspecified body region: Secondary | ICD-10-CM | POA: Diagnosis not present

## 2015-04-01 DIAGNOSIS — W108XXA Fall (on) (from) other stairs and steps, initial encounter: Secondary | ICD-10-CM | POA: Diagnosis not present

## 2015-04-01 DIAGNOSIS — O9A212 Injury, poisoning and certain other consequences of external causes complicating pregnancy, second trimester: Secondary | ICD-10-CM | POA: Insufficient documentation

## 2015-04-01 DIAGNOSIS — Z3A24 24 weeks gestation of pregnancy: Secondary | ICD-10-CM | POA: Insufficient documentation

## 2015-04-01 DIAGNOSIS — Y9389 Activity, other specified: Secondary | ICD-10-CM | POA: Insufficient documentation

## 2015-04-01 DIAGNOSIS — W19XXXA Unspecified fall, initial encounter: Secondary | ICD-10-CM

## 2015-04-01 DIAGNOSIS — Z3490 Encounter for supervision of normal pregnancy, unspecified, unspecified trimester: Secondary | ICD-10-CM

## 2015-04-01 DIAGNOSIS — Y998 Other external cause status: Secondary | ICD-10-CM | POA: Diagnosis not present

## 2015-04-01 DIAGNOSIS — Z79899 Other long term (current) drug therapy: Secondary | ICD-10-CM | POA: Diagnosis not present

## 2015-04-01 DIAGNOSIS — Z87442 Personal history of urinary calculi: Secondary | ICD-10-CM | POA: Insufficient documentation

## 2015-04-01 MED ORDER — ACETAMINOPHEN 500 MG PO TABS
1000.0000 mg | ORAL_TABLET | Freq: Once | ORAL | Status: AC
  Administered 2015-04-01: 1000 mg via ORAL
  Filled 2015-04-01: qty 2

## 2015-04-01 MED ORDER — SODIUM CHLORIDE 0.9 % IV BOLUS (SEPSIS)
1000.0000 mL | Freq: Once | INTRAVENOUS | Status: AC
  Administered 2015-04-01: 1000 mL via INTRAVENOUS

## 2015-04-01 NOTE — Progress Notes (Addendum)
Notified Dr Henderson Cloudomblin [facility]/p Advised of fht, no contractions, blood type A positive, no bleeding or leaking of vaginal fluid. Dr Henderson Cloudomblin recommends a total of 4 hours fetaql monitoring.

## 2015-04-01 NOTE — ED Notes (Signed)
Pt states she is [redacted] weeks pregnant and tripped down 7-8 steps. -states she hit her back and buttocks but is now experiencing lower abd pressure and cramping. Has received prenatal care.

## 2015-04-01 NOTE — ED Notes (Signed)
[facility] notified, en route to see pt.

## 2015-04-01 NOTE — ED Notes (Signed)
Called dr. Donnel SaxonKotut to report bp, 97/63, md acknowledges, allows for 1L bolus of normal saline.

## 2015-04-01 NOTE — ED Notes (Signed)
[facility] RN at bedside

## 2015-04-01 NOTE — ED Notes (Signed)
This note also relates to the following rows which could not be included: Pulse Rate - Cannot attach notes to unvalidated device data SpO2 - Cannot attach notes to unvalidated device data   Pt is a G4P3 at 23weeks and 6 days who reports that she fell down some steps at about 1800, she fell onto her back and right side but did not fall onto her abdomen. Pt reports positive fetal movement, no leaking of fluid and no vaginal bleeding.

## 2015-04-16 NOTE — ED Provider Notes (Signed)
CSN: [id number]     Arrival date & time 04/01/15  1805 History   First MD Initiated Contact with Patient 04/01/15 1836     Chief Complaint  Patient presents with  . Fall     (Consider location/radiation/quality/duration/timing/severity/associated sxs/prior Treatment) HPI   34 year old female presenting after fall. Patient has 23 weeks and 6 days her pregnancy. She reports mechanical fall around 1800 today. She fell on some steps onto her butt and back. Persistent pain since then. Denies any abdominal pain. Is feeling baby move. Denies any leakage of fluid or any bleeding. This is patient's fourth pregnancy. She reports no significant complications with her prior 3.  Past Medical History  Diagnosis Date  . Kidney stones    Past Surgical History  Procedure Laterality Date  . Cholecystectomy     No family history on file. Social History  Substance Use Topics  . Smoking status: Former Smoker -- 0.50 packs/day    Types: Cigarettes  . Smokeless tobacco: None  . Alcohol Use: No   OB History    Gravida Para Term Preterm AB TAB SAB Ectopic Multiple Living   4 3 3  0 0 0 0 0 0 3     Review of Systems  All systems reviewed and negative, other than as noted in HPI.   Allergies  Review of patient's allergies indicates no known allergies.  Home Medications   Prior to Admission medications   Medication Sig Start Date End Date Taking? Authorizing Provider  acetaminophen (TYLENOL) 500 MG tablet Take 1,000 mg by mouth every 6 (six) hours as needed for moderate pain.   Yes Historical Provider, MD  cyclobenzaprine (FLEXERIL) 10 MG tablet Take 10 mg by mouth at bedtime. 02/23/15  Yes Historical Provider, MD  Prenatal Vit-Fe Fumarate-FA (MULTIVITAMIN-PRENATAL) 27-0.8 MG TABS tablet Take 1 tablet by mouth daily at 12 noon.   Yes Historical Provider, MD  ranitidine (ZANTAC) 150 MG tablet Take 150 mg by mouth 2 (two) times daily. 02/01/15  Yes Historical Provider, MD  zolpidem (AMBIEN) 10 MG  tablet Take 10 mg by mouth at bedtime. 02/10/15  Yes Historical Provider, MD  HYDROcodone-acetaminophen (NORCO/VICODIN) 5-325 MG per tablet Take 1-2 tablets by mouth every 4 (four) hours as needed. For breakthrough pain. Patient not taking: Reported on 04/01/2015 08/07/14   Everlene FarrierWilliam Dansie, PA-C  naproxen (NAPROSYN) 500 MG tablet Take 1 tablet (500 mg total) by mouth 2 (two) times daily with a meal. Patient not taking: Reported on 04/01/2015 08/07/14   Everlene FarrierWilliam Dansie, PA-C  ondansetron (ZOFRAN ODT) 4 MG disintegrating tablet Take 1 tablet (4 mg total) by mouth every 8 (eight) hours as needed for nausea or vomiting. Patient not taking: Reported on 04/01/2015 08/07/14   Everlene FarrierWilliam Dansie, PA-C  oxyCODONE-acetaminophen (PERCOCET/ROXICET) 5-325 MG per tablet Take 2 tablets by mouth every 4 (four) hours as needed for severe pain. Patient not taking: Reported on 08/06/2014 07/27/14   Elson AreasLeslie K Sofia, PA-C  tamsulosin (FLOMAX) 0.4 MG CAPS capsule Take 1 capsule (0.4 mg total) by mouth daily. Patient not taking: Reported on 08/06/2014 07/27/14   Elson AreasLeslie K Sofia, PA-C   BP 95/57 mmHg  Pulse 83  Temp(Src) 97.8 F (36.6 C) (Oral)  Resp 14  SpO2 99%  LMP 08/05/2014 Physical Exam  Constitutional: She appears well-developed and well-nourished. No distress.  HENT:  Head: Normocephalic and atraumatic.  Eyes: Conjunctivae are normal. Right eye exhibits no discharge. Left eye exhibits no discharge.  Neck: Neck supple.  Cardiovascular: Normal rate, regular rhythm  and normal heart sounds.  Exam reveals no gallop and no friction rub.   No murmur heard. Pulmonary/Chest: Effort normal and breath sounds normal. No respiratory distress.  Abdominal: Soft. She exhibits no distension. There is no tenderness.  Gravid uterus with fundus palpable just above the umbilicus.  Musculoskeletal: She exhibits no edema or tenderness.  No midline spinal tenderness  Neurological: She is alert.  Skin: Skin is warm and dry.  Psychiatric: She  has a normal mood and affect. Her behavior is normal. Thought content normal.  Nursing note and vitals reviewed.   ED Course  Procedures (including critical care time) Labs Review Labs Reviewed - No data to display  Imaging Review No results found. I have personally reviewed and evaluated these images and lab results as part of my medical decision-making.   EKG Interpretation None      MDM   Final diagnoses:  Fall, initial encounter  Pregnancy    34 year old female presenting after a fall with some pain in her lower back. She is pregnant. She is observed on the monitor for several hours. No further complaints. Reassuring monitoring. Return precautions were discussed. Outpatient follow-up as needed otherwise.    Raeford Razor, MD 04/16/15 0040

## 2015-05-09 NOTE — L&D Delivery Note (Signed)
SVD of VFI.  EBL 200cc.  APGARs 6,8.  Placenta to L&D. Head delivered LOA and body followed atraumatically.  Baby to abdomen.  Cord was clamped and cut.  Baby was not immediately vigorous and was brought to the warmer for stimulation and blow by.  Cord blood was obtained.  Placenta delivered S/I/3VC.  Fundus was firmed with pitocin and massage.  First degree perineal lac was repaired with 3-0 Rapide in the normal fashion.  Mom and baby stable.  Mitchel HonourMegan Abdikadir Fohl, DO

## 2015-07-20 ENCOUNTER — Encounter (HOSPITAL_COMMUNITY): Payer: Self-pay | Admitting: *Deleted

## 2015-07-20 ENCOUNTER — Telehealth (HOSPITAL_COMMUNITY): Payer: Self-pay | Admitting: *Deleted

## 2015-07-20 LAB — OB RESULTS CONSOLE GBS: GBS: NEGATIVE

## 2015-07-20 NOTE — Telephone Encounter (Signed)
Preadmission screen  

## 2015-07-22 ENCOUNTER — Encounter (HOSPITAL_COMMUNITY): Payer: Self-pay

## 2015-07-22 ENCOUNTER — Inpatient Hospital Stay (HOSPITAL_COMMUNITY): Payer: Medicaid Other | Admitting: Anesthesiology

## 2015-07-22 ENCOUNTER — Inpatient Hospital Stay (HOSPITAL_COMMUNITY)
Admission: RE | Admit: 2015-07-22 | Discharge: 2015-07-24 | DRG: 775 | Disposition: A | Discharge status: Home / Self Care | Payer: Medicaid Other | Source: Ambulatory Visit | Attending: Obstetrics & Gynecology | Admitting: Obstetrics & Gynecology

## 2015-07-22 DIAGNOSIS — Z3A39 39 weeks gestation of pregnancy: Secondary | ICD-10-CM | POA: Diagnosis not present

## 2015-07-22 DIAGNOSIS — Z87442 Personal history of urinary calculi: Secondary | ICD-10-CM | POA: Diagnosis not present

## 2015-07-22 DIAGNOSIS — Z9049 Acquired absence of other specified parts of digestive tract: Secondary | ICD-10-CM | POA: Diagnosis present

## 2015-07-22 DIAGNOSIS — Z87891 Personal history of nicotine dependence: Secondary | ICD-10-CM | POA: Diagnosis not present

## 2015-07-22 DIAGNOSIS — Z3490 Encounter for supervision of normal pregnancy, unspecified, unspecified trimester: Secondary | ICD-10-CM

## 2015-07-22 LAB — TYPE AND SCREEN
ABO/RH(D): A POS
Antibody Screen: NEGATIVE

## 2015-07-22 LAB — CBC
HCT: 29 % — ABNORMAL LOW (ref 36.0–46.0)
Hemoglobin: 9.2 g/dL — ABNORMAL LOW (ref 12.0–15.0)
MCH: 25.5 pg — AB (ref 26.0–34.0)
MCHC: 31.7 g/dL (ref 30.0–36.0)
MCV: 80.3 fL (ref 78.0–100.0)
PLATELETS: 242 10*3/uL (ref 150–400)
RBC: 3.61 MIL/uL — AB (ref 3.87–5.11)
RDW: 16.7 % — ABNORMAL HIGH (ref 11.5–15.5)
WBC: 9.2 10*3/uL (ref 4.0–10.5)

## 2015-07-22 LAB — ABO/RH: ABO/RH(D): A POS

## 2015-07-22 LAB — RPR: RPR: NONREACTIVE

## 2015-07-22 MED ORDER — SIMETHICONE 80 MG PO CHEW: 80.0000 mg | CHEWABLE_TABLET | ORAL | Status: DC | PRN

## 2015-07-22 MED ORDER — OXYCODONE-ACETAMINOPHEN 5-325 MG PO TABS: 2.0000 | ORAL_TABLET | ORAL | Status: DC | PRN

## 2015-07-22 MED ORDER — FENTANYL 2.5 MCG/ML BUPIVACAINE 1/10 % EPIDURAL INFUSION (WH - ANES)
14.0000 mL/h | INTRAMUSCULAR | Status: DC | PRN
  Administered 2015-07-22: 14 mL/h via EPIDURAL

## 2015-07-22 MED ORDER — PHENYLEPHRINE 40 MCG/ML (10ML) SYRINGE FOR IV PUSH (FOR BLOOD PRESSURE SUPPORT)
80.0000 ug | PREFILLED_SYRINGE | INTRAVENOUS | Status: DC | PRN
  Filled 2015-07-22: qty 2

## 2015-07-22 MED ORDER — OXYCODONE-ACETAMINOPHEN 5-325 MG PO TABS: 1.0000 | ORAL_TABLET | ORAL | Status: DC | PRN

## 2015-07-22 MED ORDER — SENNOSIDES-DOCUSATE SODIUM 8.6-50 MG PO TABS
2.0000 | ORAL_TABLET | ORAL | Status: DC
  Administered 2015-07-22 – 2015-07-24 (×2): 2 via ORAL
  Filled 2015-07-22 (×2): qty 2

## 2015-07-22 MED ORDER — OXYTOCIN 10 UNIT/ML IJ SOLN
2.5000 [IU]/h | INTRAMUSCULAR | Status: DC
  Filled 2015-07-22: qty 10

## 2015-07-22 MED ORDER — EPHEDRINE 5 MG/ML INJ
10.0000 mg | INTRAVENOUS | Status: DC | PRN
  Filled 2015-07-22: qty 2

## 2015-07-22 MED ORDER — DIPHENHYDRAMINE HCL 25 MG PO CAPS: 25.0000 mg | ORAL_CAPSULE | Freq: Four times a day (QID) | ORAL | Status: DC | PRN

## 2015-07-22 MED ORDER — TERBUTALINE SULFATE 1 MG/ML IJ SOLN
0.2500 mg | Freq: Once | INTRAMUSCULAR | Status: DC | PRN
  Filled 2015-07-22: qty 1

## 2015-07-22 MED ORDER — LACTATED RINGERS IV SOLN: 500.0000 mL | Freq: Once | INTRAVENOUS | Status: DC

## 2015-07-22 MED ORDER — OXYTOCIN BOLUS FROM INFUSION: 500.0000 mL | INTRAVENOUS | Status: DC

## 2015-07-22 MED ORDER — OXYTOCIN 10 UNIT/ML IJ SOLN
1.0000 m[IU]/min | INTRAVENOUS | Status: DC
  Administered 2015-07-22: 2 m[IU]/min via INTRAVENOUS

## 2015-07-22 MED ORDER — LIDOCAINE HCL (PF) 1 % IJ SOLN
INTRAMUSCULAR | Status: DC | PRN
  Administered 2015-07-22 (×2): 4 mL via EPIDURAL

## 2015-07-22 MED ORDER — LACTATED RINGERS IV SOLN
500.0000 mL | INTRAVENOUS | Status: DC | PRN
Start: 2015-07-22 — End: 2015-07-22

## 2015-07-22 MED ORDER — TETANUS-DIPHTH-ACELL PERTUSSIS 5-2.5-18.5 LF-MCG/0.5 IM SUSP: 0.5000 mL | Freq: Once | INTRAMUSCULAR | Status: DC

## 2015-07-22 MED ORDER — DIBUCAINE 1 % RE OINT: 1.0000 "application " | TOPICAL_OINTMENT | RECTAL | Status: DC | PRN

## 2015-07-22 MED ORDER — PHENYLEPHRINE 40 MCG/ML (10ML) SYRINGE FOR IV PUSH (FOR BLOOD PRESSURE SUPPORT)
PREFILLED_SYRINGE | INTRAVENOUS | Status: AC
  Filled 2015-07-22: qty 20

## 2015-07-22 MED ORDER — OXYCODONE-ACETAMINOPHEN 5-325 MG PO TABS
1.0000 | ORAL_TABLET | ORAL | Status: DC | PRN
  Administered 2015-07-23: 1 via ORAL

## 2015-07-22 MED ORDER — LANOLIN HYDROUS EX OINT: TOPICAL_OINTMENT | CUTANEOUS | Status: DC | PRN

## 2015-07-22 MED ORDER — LIDOCAINE HCL (PF) 1 % IJ SOLN
30.0000 mL | INTRAMUSCULAR | Status: DC | PRN
  Filled 2015-07-22: qty 30

## 2015-07-22 MED ORDER — BENZOCAINE-MENTHOL 20-0.5 % EX AERO
1.0000 "application " | INHALATION_SPRAY | CUTANEOUS | Status: DC | PRN
  Administered 2015-07-22: 1 via TOPICAL
  Filled 2015-07-22: qty 56

## 2015-07-22 MED ORDER — BUTORPHANOL TARTRATE 1 MG/ML IJ SOLN: 1.0000 mg | INTRAMUSCULAR | Status: DC | PRN

## 2015-07-22 MED ORDER — OXYCODONE-ACETAMINOPHEN 5-325 MG PO TABS
2.0000 | ORAL_TABLET | ORAL | Status: DC | PRN
  Administered 2015-07-24: 2 via ORAL
  Filled 2015-07-22 (×2): qty 2

## 2015-07-22 MED ORDER — CITRIC ACID-SODIUM CITRATE 334-500 MG/5ML PO SOLN: 30.0000 mL | ORAL | Status: DC | PRN

## 2015-07-22 MED ORDER — DIPHENHYDRAMINE HCL 50 MG/ML IJ SOLN: 12.5000 mg | INTRAMUSCULAR | Status: DC | PRN

## 2015-07-22 MED ORDER — FLEET ENEMA 7-19 GM/118ML RE ENEM: 1.0000 | ENEMA | RECTAL | Status: DC | PRN

## 2015-07-22 MED ORDER — PHENYLEPHRINE 40 MCG/ML (10ML) SYRINGE FOR IV PUSH (FOR BLOOD PRESSURE SUPPORT)
80.0000 ug | PREFILLED_SYRINGE | INTRAVENOUS | Status: DC | PRN
  Administered 2015-07-22: 80 ug via INTRAVENOUS
  Filled 2015-07-22: qty 2

## 2015-07-22 MED ORDER — ACETAMINOPHEN 325 MG PO TABS: 650.0000 mg | ORAL_TABLET | ORAL | Status: DC | PRN

## 2015-07-22 MED ORDER — PRENATAL MULTIVITAMIN CH
1.0000 | ORAL_TABLET | Freq: Every day | ORAL | Status: DC
  Administered 2015-07-23 – 2015-07-24 (×2): 1 via ORAL
  Filled 2015-07-22 (×2): qty 1

## 2015-07-22 MED ORDER — ONDANSETRON HCL 4 MG/2ML IJ SOLN: 4.0000 mg | INTRAMUSCULAR | Status: DC | PRN

## 2015-07-22 MED ORDER — IBUPROFEN 600 MG PO TABS
600.0000 mg | ORAL_TABLET | Freq: Four times a day (QID) | ORAL | Status: DC
  Administered 2015-07-22 – 2015-07-24 (×7): 600 mg via ORAL
  Filled 2015-07-22 (×7): qty 1

## 2015-07-22 MED ORDER — ONDANSETRON HCL 4 MG/2ML IJ SOLN: 4.0000 mg | Freq: Four times a day (QID) | INTRAMUSCULAR | Status: DC | PRN

## 2015-07-22 MED ORDER — LACTATED RINGERS IV SOLN
INTRAVENOUS | Status: DC
  Administered 2015-07-22 (×2): via INTRAVENOUS

## 2015-07-22 MED ORDER — ZOLPIDEM TARTRATE 5 MG PO TABS: 5.0000 mg | ORAL_TABLET | Freq: Every evening | ORAL | Status: DC | PRN

## 2015-07-22 MED ORDER — WITCH HAZEL-GLYCERIN EX PADS: 1.0000 "application " | MEDICATED_PAD | CUTANEOUS | Status: DC | PRN

## 2015-07-22 MED ORDER — FENTANYL 2.5 MCG/ML BUPIVACAINE 1/10 % EPIDURAL INFUSION (WH - ANES)
INTRAMUSCULAR | Status: AC
  Filled 2015-07-22: qty 125

## 2015-07-22 MED ORDER — ONDANSETRON HCL 4 MG PO TABS: 4.0000 mg | ORAL_TABLET | ORAL | Status: DC | PRN

## 2015-07-22 NOTE — Anesthesia Procedure Notes (Signed)
Epidural Patient location during procedure: OB Start time: 07/22/2015 1:22 PM End time: 07/22/2015 1:37 PM  Staffing Anesthesiologist: Heather RobertsSINGER, Dom Haverland Performed by: anesthesiologist   Preanesthetic Checklist Completed: patient identified, site marked, pre-op evaluation, timeout performed, IV checked, risks and benefits discussed and monitors and equipment checked  Epidural Patient position: sitting Prep: DuraPrep Patient monitoring: heart rate, cardiac monitor, continuous pulse ox and blood pressure Approach: midline Location: L2-L3 Injection technique: LOR saline  Needle:  Needle type: Tuohy  Needle gauge: 17 G Needle length: 9 cm Needle insertion depth: 7 cm Catheter size: 20 Guage Catheter at skin depth: 11 cm Test dose: negative and Other  Assessment Events: blood not aspirated, injection not painful, no injection resistance and negative IV test  Additional Notes Informed consent obtained prior to proceeding including risk of failure, 1% risk of PDPH, risk of minor discomfort and bruising.  Discussed rare but serious complications including epidural abscess, permanent nerve injury, epidural hematoma.  Discussed alternatives to epidural analgesia and patient desires to proceed.  Timeout performed pre-procedure verifying patient name, procedure, and platelet count.  Patient tolerated procedure well.

## 2015-07-22 NOTE — Anesthesia Preprocedure Evaluation (Addendum)
Anesthesia Evaluation  Patient identified by MRN, date of birth, ID band Patient awake    Reviewed: Allergy & Precautions, NPO status , Patient's Chart, lab work & pertinent test results  History of Anesthesia Complications Negative for: history of anesthetic complications  Airway Mallampati: II  TM Distance: >3 FB     Dental  (+) Teeth Intact, Dental Advisory Given   Pulmonary former smoker,    Pulmonary exam normal        Cardiovascular negative cardio ROS Normal cardiovascular exam     Neuro/Psych negative neurological ROS  negative psych ROS   GI/Hepatic negative GI ROS, Neg liver ROS,   Endo/Other  negative endocrine ROS  Renal/GU negative Renal ROS  negative genitourinary   Musculoskeletal negative musculoskeletal ROS (+)   Abdominal   Peds negative pediatric ROS (+)  Hematology negative hematology ROS (+)   Anesthesia Other Findings   Reproductive/Obstetrics negative OB ROS                            Anesthesia Physical Anesthesia Plan  ASA: II  Anesthesia Plan: Epidural   Post-op Pain Management:    Induction:   Airway Management Planned:   Additional Equipment:   Intra-op Plan:   Post-operative Plan:   Informed Consent: I have reviewed the patients History and Physical, chart, labs and discussed the procedure including the risks, benefits and alternatives for the proposed anesthesia with the patient or authorized representative who has indicated his/her understanding and acceptance.   Dental advisory given  Plan Discussed with: Anesthesiologist  Anesthesia Plan Comments:         Anesthesia Quick Evaluation

## 2015-07-22 NOTE — H&P (Signed)
Nicole Molina is a 35 y.o. female presenting for elective IOL.  Antepartum course uncomplicated.  Negative GBS.  Plans pp BTL.  Maternal Medical History:  Contractions: Onset was 1 week ago.   Frequency: rare.   Perceived severity is mild.    Fetal activity: Perceived fetal activity is normal.   Last perceived fetal movement was within the past hour.    Prenatal complications: no prenatal complications Prenatal Complications - Diabetes: none.    OB History    Gravida Para Term Preterm AB TAB SAB Ectopic Multiple Living   4 3 3  0 0 0 0 0 0 3     Past Medical History  Diagnosis Date  . Kidney stones   . Hx of varicella    Past Surgical History  Procedure Laterality Date  . Cholecystectomy     Family History: family history is not on file. Social History:  reports that she has quit smoking. Her smoking use included Cigarettes. She smoked 0.50 packs per day. She does not have any smokeless tobacco history on file. She reports that she does not drink alcohol or use illicit drugs.   Prenatal Transfer Tool  Maternal Diabetes: No Genetic Screening: Normal Maternal Ultrasounds/Referrals: Normal Fetal Ultrasounds or other Referrals:  None Maternal Substance Abuse:  No Significant Maternal Medications:  None Significant Maternal Lab Results:  Lab values include: Group B Strep negative Other Comments:  None  ROS    Last menstrual period 08/05/2014. Maternal Exam:  Uterine Assessment: Contraction strength is mild.  Contraction frequency is rare.   Abdomen: Patient reports no abdominal tenderness. Fundal height is c/w dates.   Estimated fetal weight is 7#4.       Physical Exam  Constitutional: She is oriented to person, place, and time. She appears well-developed and well-nourished.  GI: Soft. There is no rebound and no guarding.  Neurological: She is alert and oriented to person, place, and time.  Skin: Skin is warm and dry.  Psychiatric: She has a normal mood and  affect. Her behavior is normal.    Prenatal labs: ABO, Rh: A/Positive/-- (08/23 0000) Antibody: Negative (08/23 0000) Rubella: Immune (08/23 0000) RPR: Nonreactive (08/23 0000)  HBsAg: Negative (08/23 0000)  HIV: Non-reactive (08/23 0000)  GBS: Negative (03/14 0000)   Assessment/Plan: 34yo [id number] at 1238w6d for IOL -Plan AROM -Pitocin prn -Epidural when ready -Anticipate NSVD   Nicole Molina 07/22/2015, 7:47 AM

## 2015-07-23 ENCOUNTER — Inpatient Hospital Stay (HOSPITAL_COMMUNITY)
Admission: AD | Admit: 2015-07-23 | Payer: Medicaid Other | Source: Ambulatory Visit | Admitting: Obstetrics and Gynecology

## 2015-07-23 LAB — CBC
HCT: 28.7 % — ABNORMAL LOW (ref 36.0–46.0)
HEMOGLOBIN: 9 g/dL — AB (ref 12.0–15.0)
MCH: 25.6 pg — ABNORMAL LOW (ref 26.0–34.0)
MCHC: 31.4 g/dL (ref 30.0–36.0)
MCV: 81.5 fL (ref 78.0–100.0)
Platelets: 250 10*3/uL (ref 150–400)
RBC: 3.52 MIL/uL — ABNORMAL LOW (ref 3.87–5.11)
RDW: 17.1 % — AB (ref 11.5–15.5)
WBC: 10 10*3/uL (ref 4.0–10.5)

## 2015-07-23 NOTE — Progress Notes (Signed)
Post Partum Day 1 Subjective: up ad lib, voiding, tolerating PO, + flatus and complains of cramping  Objective: Blood pressure 101/60, pulse 88, temperature 98.5 F (36.9 C), temperature source Oral, resp. rate 16, height 5' (1.524 m), weight 177 lb (80.287 kg), last menstrual period 08/05/2014, SpO2 99 %, unknown if currently breastfeeding.  Physical Exam:  General: alert and cooperative Lochia: appropriate Uterine Fundus: firm Incision: healing well DVT Evaluation: No evidence of DVT seen on physical exam. Negative Homan's sign. No cords or calf tenderness. No significant calf/ankle edema.   Recent Labs  07/22/15 0756 07/23/15 0523  HGB 9.2* 9.0*  HCT 29.0* 28.7*    Assessment/Plan: Plan for discharge tomorrow   LOS: 1 day   Nicole Molina G 07/23/2015, 8:19 AM

## 2015-07-23 NOTE — Anesthesia Postprocedure Evaluation (Signed)
Anesthesia Post Note  Patient: Nicole Molina  Procedure(s) Performed: * No procedures listed *  Patient location during evaluation: Mother Baby Anesthesia Type: Epidural Level of consciousness: awake, awake and alert and oriented Pain management: pain level controlled Vital Signs Assessment: post-procedure vital signs reviewed and stable Respiratory status: spontaneous breathing Cardiovascular status: stable Postop Assessment: patient able to bend at knees, no signs of nausea or vomiting and adequate PO intake Anesthetic complications: no    Last Vitals:  Filed Vitals:   07/23/15 0010 07/23/15 0524  BP: 105/53 101/60  Pulse: 79 88  Temp: 36.8 C 36.9 C  Resp: 16 16    Last Pain:  Filed Vitals:   07/23/15 0525  PainSc: 8                  Greysin Medlen Hristova

## 2015-07-23 NOTE — Lactation Note (Signed)
This note was copied from a baby's chart. Lactation Consultation Note  Patient Name: Nicole Molina'UToday's Date: 07/23/2015 Reason for consult: Initial assessment Baby is 18 hours old and has been to to the breast several times 10 -20 mins , latch score 7 , at consult LS 9.  This is an experienced breast feeding mother of 3 others whom she breast fed each exclusively one year without problems.  Per mom with this abby she will have to return to work at 6 weeks and has WIC. LC explained she can exclusively breast feed for the  Next 3 weeks , hand express , or use hand pump initially when needed for when the milk comes in. [facility]  [facility] and start extar pumping to prepare to transition back to work and introduce a bottle at 4 weeks.  @ consult reviewed hand expressing and showed mom how to do it. Per mom she was familiar with technique. Several large drops of colostrum noted.  Baby latched , worked on positioning and depth with mom , and baby fed 10 mins with multiply swallows , latch score 9.  @ consult - baby had a large wet and large mec stool , LC changed diaper. And undated with previous feeding per mom and the feeding at consult. Mother informed of post-discharge support and given phone number to the lactation department, including services for phone call assistance; out-patient appointments; and breastfeeding support group. List of other breastfeeding resources in the community given in the handout. Encouraged mother to call for problems or concerns related to breastfeeding. Baby gaggy during diaper change - reviewed bulb syringe with mom.    Maternal Data Has patient been taught Hand Expression?: Yes Does the patient have breastfeeding experience prior to this delivery?: Yes  Feeding Feeding Type: Breast Fed Length of feed: 15 min (per mom )  LATCH Score/Interventions Latch: Grasps breast easily, tongue down, lips flanged, rhythmical  sucking. Intervention(s): Adjust position;Assist with latch;Breast massage;Breast compression  Audible Swallowing: Spontaneous and intermittent  Type of Nipple: Everted at rest and after stimulation  Comfort (Breast/Nipple): Soft / non-tender     Hold (Positioning): Assistance needed to correctly position infant at breast and maintain latch. Intervention(s): Breastfeeding basics reviewed  LATCH Score: 9  Lactation Tools Discussed/Used WIC Program: Yes (per mom [place] )   Consult Status Consult Status: Follow-up Date: 07/24/15 Follow-up type: In-patient    Kathrin Greathouseorio, Kylin Dubs Ann 07/23/2015, 9:39 AM

## 2015-07-24 MED ORDER — FERROUS SULFATE 325 (65 FE) MG PO TABS: 325.0000 mg | ORAL_TABLET | Freq: Every day | ORAL | Status: DC

## 2015-07-24 MED ORDER — IBUPROFEN 600 MG PO TABS: 600.0000 mg | ORAL_TABLET | Freq: Four times a day (QID) | ORAL | Status: DC | PRN

## 2015-07-24 MED ORDER — OXYCODONE-ACETAMINOPHEN 5-325 MG PO TABS: 1.0000 | ORAL_TABLET | ORAL | Status: DC | PRN

## 2015-07-24 NOTE — Lactation Note (Signed)
This note was copied from a baby's chart. Lactation Consultation Note  Patient Name: Nicole Molina [name] ZOXWR'UToday's Date: 07/24/2015 Reason for consult: Follow-up assessment;Infant < 6lbs   Follow up with EXP BF mom of 42 hour old infant. Infant with 11 BF for 10-120 minutes, 1 attempt, 3 voids and 6 stools in last 24 hours. LATCH Scores 9-10 by bedside RN's. Infant weight 5 lb 11.9 oz with a 6% weight loss since birth.   Mom reports infant has been cluster feeding. Mom had infant latched to right breast in cradle hold and mom was giving infant breathing space. Mom with large fleshy breasts and compressible areola. Nipples are large and long and easy for infant to latch only to nipple. Infant kept pushing nipple out of her mouth. Enc mom to make sure infant is latched deeply to maximize milk transfer. Assisted with positioning so as not to use breathing space. Mom reports she feels fuller today and is experiencing pain with initial latch that improves with feeding. Enc her to use EBM to nipples post feed. Nipple tissue intact. Enc mom to compress/massage breasts with feeding. Infant was able to latch well when mom used cross cradle hold for latching and was noted to have flanged lips, rhythmic sucking and intermittent swallows. Large gtts colostrum noted with hand expression bilaterally.  Enc mom to feed 8-12 x in 24 hours and due to infant size to awaken infant at 3 hours if she is not awake to feed. Enc mom to keep infant awake as needed for feeding.   Mom works for [employer] office that infant will be seen at, mom has appt for Monday for infant. [facility] and will call to schedule a f/u appt. Mom with manual pump to take home.  Reviewed all BF information in Taking Care of Baby and Me Booklet. Reviewed Engorgement prevention/treatment/comfort pumping with mom. Reviewed I/O and to maintain feeding log and take to PEd appt Monday.   Reviewed LC Brochure, mom aware or OP services, BF  Support Groups and LC Phone #. Enc mom to call with questions/concerns prn.   Maternal Data Formula Feeding for Exclusion: No Has patient been taught Hand Expression?: Yes Does the patient have breastfeeding experience prior to this delivery?: Yes  Feeding Feeding Type: Breast Fed Length of feed: 15 min  LATCH Score/Interventions Latch: Grasps breast easily, tongue down, lips flanged, rhythmical sucking. Intervention(s): Adjust position;Assist with latch;Breast massage;Breast compression  Audible Swallowing: A few with stimulation Intervention(s): Hand expression;Alternate breast massage  Type of Nipple: Everted at rest and after stimulation  Comfort (Breast/Nipple): Filling, red/small blisters or bruises, mild/mod discomfort  Problem noted: Mild/Moderate discomfort (with initial latch)  Hold (Positioning): Assistance needed to correctly position infant at breast and maintain latch. Intervention(s): Breastfeeding basics reviewed;Support Pillows;Position options;Skin to skin  LATCH Score: 7  Lactation Tools Discussed/Used WIC Program: Yes Pump Review: Setup, frequency, and cleaning;Milk Storage   Consult Status Consult Status: Complete Follow-up type: Call as needed    Ed BlalockSharon S [place]s 07/24/2015, 10:13 AM

## 2015-07-24 NOTE — Discharge Summary (Signed)
Obstetric Discharge Summary Reason for Admission: induction of labor Prenatal Procedures: none Intrapartum Procedures: spontaneous vaginal delivery Postpartum Procedures: none Complications-Operative and Postpartum: none HEMOGLOBIN  Date Value Ref Range Status  07/23/2015 9.0* 12.0 - 15.0 g/dL Final   HCT  Date Value Ref Range Status  07/23/2015 28.7* 36.0 - 46.0 % Final    Physical Exam:  General: alert Lochia: appropriate Uterine Fundus: firm Incision: healing well DVT Evaluation: No evidence of DVT seen on physical exam.  Discharge Diagnoses: Term Pregnancy-delivered  Discharge Information: Date: 07/24/2015 Activity: pelvic rest Diet: routine Medications: PNV, Ibuprofen and Iron Condition: stable Instructions: refer to practice specific booklet Discharge to: home Follow-up Information    Follow up with Meriel PicaHOLLAND,Jaleiyah Alas M, MD In 6 weeks.   Specialty:  Obstetrics and Gynecology   Contact information:   [address] [place] [address] [phone]       Newborn Data: Live born female  Birth Weight: 6 lb 2.1 oz (2780 g) APGAR: 6, 8  Home with mother.  Ruhama Lehew M 07/24/2015, 9:03 AM

## 2015-07-24 NOTE — Progress Notes (Signed)
Post Partum Day 2 Subjective: no complaints  Objective: Blood pressure 111/66, pulse 76, temperature 98.2 F (36.8 C), temperature source Oral, resp. rate 18, height 5' (1.524 m), weight 177 lb (80.287 kg), last menstrual period 08/05/2014, SpO2 99 %, unknown if currently breastfeeding.  Physical Exam:  General: alert Lochia: appropriate Uterine Fundus: firm Incision: healing well DVT Evaluation: No evidence of DVT seen on physical exam.   Recent Labs  07/22/15 0756 07/23/15 0523  HGB 9.2* 9.0*  HCT 29.0* 28.7*    Assessment/Plan: Discharge home   LOS: 2 days   Nik Gorrell M 07/24/2015, 8:59 AM

## 2019-03-19 ENCOUNTER — Ambulatory Visit: Payer: Commercial Managed Care - PPO | Admitting: Neurology

## 2019-05-12 ENCOUNTER — Emergency Department (HOSPITAL_COMMUNITY): Payer: Commercial Managed Care - PPO

## 2019-05-12 ENCOUNTER — Other Ambulatory Visit: Payer: Self-pay

## 2019-05-12 ENCOUNTER — Encounter (HOSPITAL_COMMUNITY): Payer: Self-pay | Admitting: *Deleted

## 2019-05-12 ENCOUNTER — Emergency Department (HOSPITAL_COMMUNITY)
Admission: EM | Admit: 2019-05-12 | Discharge: 2019-05-12 | Disposition: A | Discharge status: Home / Self Care | Payer: Commercial Managed Care - PPO | Attending: Emergency Medicine | Admitting: Emergency Medicine

## 2019-05-12 DIAGNOSIS — Z79899 Other long term (current) drug therapy: Secondary | ICD-10-CM | POA: Diagnosis not present

## 2019-05-12 DIAGNOSIS — R0789 Other chest pain: Secondary | ICD-10-CM | POA: Diagnosis not present

## 2019-05-12 DIAGNOSIS — J069 Acute upper respiratory infection, unspecified: Secondary | ICD-10-CM | POA: Diagnosis not present

## 2019-05-12 DIAGNOSIS — Z20822 Contact with and (suspected) exposure to covid-19: Secondary | ICD-10-CM | POA: Diagnosis not present

## 2019-05-12 DIAGNOSIS — R05 Cough: Secondary | ICD-10-CM

## 2019-05-12 DIAGNOSIS — Z87891 Personal history of nicotine dependence: Secondary | ICD-10-CM | POA: Insufficient documentation

## 2019-05-12 DIAGNOSIS — R059 Cough, unspecified: Secondary | ICD-10-CM

## 2019-05-12 LAB — CBC
HCT: 41 % (ref 36.0–46.0)
Hemoglobin: 13.4 g/dL (ref 12.0–15.0)
MCH: 30.5 pg (ref 26.0–34.0)
MCHC: 32.7 g/dL (ref 30.0–36.0)
MCV: 93.4 fL (ref 80.0–100.0)
Platelets: 279 10*3/uL (ref 150–400)
RBC: 4.39 MIL/uL (ref 3.87–5.11)
RDW: 12 % (ref 11.5–15.5)
WBC: 8.5 10*3/uL (ref 4.0–10.5)
nRBC: 0 % (ref 0.0–0.2)

## 2019-05-12 LAB — D-DIMER, QUANTITATIVE: D-Dimer, Quant: 0.51 ug/mL-FEU — ABNORMAL HIGH (ref 0.00–0.50)

## 2019-05-12 LAB — BASIC METABOLIC PANEL
Anion gap: 11 (ref 5–15)
BUN: 17 mg/dL (ref 6–20)
CO2: 21 mmol/L — ABNORMAL LOW (ref 22–32)
Calcium: 8.8 mg/dL — ABNORMAL LOW (ref 8.9–10.3)
Chloride: 107 mmol/L (ref 98–111)
Creatinine, Ser: 0.68 mg/dL (ref 0.44–1.00)
GFR calc Af Amer: >60 mL/min (ref >60)
GFR calc non Af Amer: >60 mL/min (ref >60)
Glucose, Bld: 103 mg/dL — ABNORMAL HIGH (ref 70–99)
Potassium: 3.7 mmol/L (ref 3.5–5.1)
Sodium: 139 mmol/L (ref 135–145)

## 2019-05-12 LAB — POC SARS CORONAVIRUS 2 AG -  ED: SARS Coronavirus 2 Ag: NEGATIVE

## 2019-05-12 LAB — I-STAT BETA HCG BLOOD, ED (MC, WL, AP ONLY): I-stat hCG, quantitative: <5 m[IU]/mL (ref <5)

## 2019-05-12 LAB — TROPONIN I (HIGH SENSITIVITY)
Troponin I (High Sensitivity): 3 ng/L (ref <18)
Troponin I (High Sensitivity): <2 ng/L (ref <18)

## 2019-05-12 LAB — SARS CORONAVIRUS 2 (TAT 6-24 HRS): SARS Coronavirus 2: NEGATIVE

## 2019-05-12 MED ORDER — IOHEXOL 350 MG/ML SOLN
100.0000 mL | Freq: Once | INTRAVENOUS | Status: AC | PRN
  Administered 2019-05-12: 56 mL via INTRAVENOUS

## 2019-05-12 MED ORDER — HYDROCODONE-ACETAMINOPHEN 5-325 MG PO TABS: 1.0000 | ORAL_TABLET | ORAL | 0 refills | Status: DC | PRN

## 2019-05-12 MED ORDER — SODIUM CHLORIDE 0.9% FLUSH: 3.0000 mL | Freq: Once | INTRAVENOUS | Status: DC

## 2019-05-12 NOTE — ED Triage Notes (Signed)
Pt reports nonproductive cough for several days, no fever. Now has left side rib pain that radiates to her back and mid chest pain. Unable to sleep due to pain. Airway intact and ekg done.

## 2019-05-12 NOTE — ED Notes (Signed)
Pt given dc instructions pt verbalizes understanding.  

## 2019-05-12 NOTE — Discharge Instructions (Addendum)
Return if any problems.

## 2019-05-12 NOTE — ED Provider Notes (Signed)
[facility] EMERGENCY DEPARTMENT Provider Note   CSN: [id number] Arrival date & time: 05/12/19  9233     History Chief Complaint  Patient presents with  . Cough  . Chest Pain    Nicole Molina is a 39 y.o. female.  The history is provided by the patient. No language interpreter was used.  Cough Cough characteristics:  Non-productive Sputum characteristics:  Nondescript Severity:  Moderate Onset quality:  Gradual Duration:  2 days Timing:  Constant Progression:  Worsening Smoker: no   Relieved by:  Nothing Worsened by:  Nothing Associated symptoms: chest pain   Chest Pain Associated symptoms: cough    Pt complains of pain in her chest.  Pt reports pain is in the left side of her chest.  Pt complains of pain when she moves.  Pt denies any covid exposure.     Past Medical History:  Diagnosis Date  . Hx of varicella   . Kidney stones     Patient Active Problem List   Diagnosis Date Noted  . Pregnancy 07/22/2015    Past Surgical History:  Procedure Laterality Date  . CHOLECYSTECTOMY       OB History    Gravida  4   Para  4   Term  4   Preterm  0   AB  0   Living  4     SAB  0   TAB  0   Ectopic  0   Multiple  0   Live Births  4           History reviewed. No pertinent family history.  Social History   Tobacco Use  . Smoking status: Former Smoker    Packs/day: 0.50    Types: Cigarettes  Substance Use Topics  . Alcohol use: No  . Drug use: No    Home Medications Prior to Admission medications   Medication Sig Start Date End Date Taking? Authorizing Provider  cyclobenzaprine (FLEXERIL) 10 MG tablet Take 10 mg by mouth.    [provider]  ferrous sulfate (FERROUSUL) 325 (65 FE) MG tablet Take 1 tablet (325 mg total) by mouth daily with breakfast. 07/24/15   Richarda Overlie, MD  HYDROcodone-acetaminophen (NORCO/VICODIN) 5-325 MG tablet Take 1 tablet by mouth every 4 (four) hours as needed. 05/12/19   Elson Areas, PA-C  ibuprofen (ADVIL,MOTRIN) 600 MG tablet Take 1 tablet (600 mg total) by mouth every 6 (six) hours as needed for moderate pain. 07/24/15   Richarda Overlie, MD  levonorgestrel (MIRENA) 20 MCG/24HR IUD 1 each by Intrauterine route once.    [provider]  oxyCODONE-acetaminophen (PERCOCET/ROXICET) 5-325 MG tablet Take 1 tablet by mouth every 4 (four) hours as needed (pain scale 4-7). 07/24/15   Richarda Overlie, MD  pantoprazole (PROTONIX) 40 MG tablet Take 40 mg by mouth daily.    [provider]  zolpidem (AMBIEN) 10 MG tablet Take 10 mg by mouth at bedtime as needed for sleep.    [provider]    Allergies    Patient has no known allergies.  Review of Systems   Review of Systems  Respiratory: Positive for cough.   Cardiovascular: Positive for chest pain.  All other systems reviewed and are negative.   Physical Exam Updated Vital Signs BP 106/67   Pulse 66   Temp 98.1 F (36.7 C) (Oral)   Resp 16   SpO2 99%   Physical Exam Vitals and nursing note reviewed.  Constitutional:  Appearance: She is well-developed.  HENT:     Head: Normocephalic.  Cardiovascular:     Rate and Rhythm: Normal rate.     Heart sounds: Normal heart sounds.  Pulmonary:     Effort: Pulmonary effort is normal.     Breath sounds: Normal breath sounds.  Abdominal:     General: There is no distension.     Palpations: Abdomen is soft.  Musculoskeletal:        General: Normal range of motion.     Cervical back: Normal range of motion.  Skin:    General: Skin is warm.  Neurological:     General: No focal deficit present.     Mental Status: She is alert and oriented to person, place, and time.     ED Results / Procedures / Treatments   Labs (all labs ordered are listed, but only abnormal results are displayed) Labs Reviewed  BASIC METABOLIC PANEL - Abnormal; Notable for the following components:      Result Value   CO2 21 (*)    Glucose, Bld 103 (*)     Calcium 8.8 (*)    All other components within normal limits  D-DIMER, QUANTITATIVE (NOT AT [facility]) - Abnormal; Notable for the following components:   D-Dimer, Quant 0.51 (*)    All other components within normal limits  SARS CORONAVIRUS 2 (TAT 6-24 HRS)  CBC  I-STAT BETA HCG BLOOD, ED ([facility], [facility], [facility] ONLY)  POC SARS CORONAVIRUS 2 AG -  ED  TROPONIN I (HIGH SENSITIVITY)  TROPONIN I (HIGH SENSITIVITY)    EKG EKG Interpretation  Date/Time:  Monday May 12 2019 07:38:15 EST Ventricular Rate:  96 PR Interval:  148 QRS Duration: 80 QT Interval:  364 QTC Calculation: 459 R Axis:   45 Text Interpretation: Normal sinus rhythm Cannot rule out Inferior infarct , age undetermined Abnormal ECG Confirmed by Blane Ohara [phone]) on 05/12/2019 8:26:43 AM   Radiology DG Chest 2 View  Result Date: 05/12/2019 CLINICAL DATA:  39 year old female with cough, congestion for 3 days. Chest pain and shortness of breath since 0200 hours, diaphoresis. EXAM: CHEST - 2 VIEW COMPARISON:  Chest radiographs 07/12/2014. FINDINGS: Lung volumes and mediastinal contours remain normal. Visualized tracheal air column is within normal limits. Both lungs appear stable in clear. No pneumothorax or pleural effusion. Stable cholecystectomy clips. Negative visible bowel gas pattern. No acute osseous abnormality identified. Chronic degenerative endplate spurring anteriorly in the lower thoracic spine, where there might be developing interbody ankylosis on the lateral view. IMPRESSION: Negative.  No cardiopulmonary abnormality. Electronically Signed   By: Odessa Fleming M.D.   On: 05/12/2019 09:04   CT Angio Chest PE W and/or Wo Contrast  Result Date: 05/12/2019 CLINICAL DATA:  Chest pain EXAM: CT ANGIOGRAPHY CHEST WITH CONTRAST TECHNIQUE: Multidetector CT imaging of the chest was performed using the standard protocol during bolus administration of intravenous contrast. Multiplanar CT image reconstructions and MIPs were obtained to  evaluate the vascular anatomy. CONTRAST:  100 mL Isovue 370 nonionic COMPARISON:  Chest radiograph May 12, 2019 FINDINGS: Cardiovascular: There is no demonstrable pulmonary embolus. There is no appreciable thoracic aortic aneurysm. No dissection appreciable. Note that the contrast bolus in the aorta is less than optimal for potential dissection assessment. The visualized great vessels appear unremarkable. There is no pericardial effusion or pericardial thickening. Mediastinum/Nodes: Thyroid appears unremarkable. There is no demonstrable thoracic adenopathy. There is a small hiatal hernia. Lungs/Pleura: There is mild bibasilar atelectasis. There is no edema  or consolidation. No appreciable pleural effusion. Upper Abdomen: Visualized upper abdominal structures appear unremarkable. Musculoskeletal: No blastic or lytic bone lesions. No chest wall lesion evident. Review of the MIP images confirms the above findings. IMPRESSION: 1. No demonstrable pulmonary embolus. No thoracic aortic aneurysm. No dissection evident. Note that the contrast bolus in the aorta is not sufficient for optimal for potential dissection assessment. 2.  Mild bibasilar atelectasis.  Lungs otherwise clear. 3.  Small hiatal hernia. 4.  No evident adenopathy. Electronically Signed   By: Lowella Grip III M.D.   On: 05/12/2019 14:55    Procedures Procedures (including critical care time)  Medications Ordered in ED Medications  sodium chloride flush (NS) 0.9 % injection 3 mL (has no administration in time range)  iohexol (OMNIPAQUE) 350 MG/ML injection 100 mL (56 mLs Intravenous Contrast Given 05/12/19 1450)    ED Course  I have reviewed the triage vital signs and the nursing notes.  Pertinent labs & imaging results that were available during my care of the patient were reviewed by me and considered in my medical decision making (see chart for details).    MDM Rules/Calculators/A&P                      MDM   Covid test pending.   Chest wall pain may be second to covid.  Pt advised to quarantine An After Visit Summary was printed and given to the patient.  Final Clinical Impression(s) / ED Diagnoses Final diagnoses:  Chest wall pain  Cough  Upper respiratory tract infection, unspecified type    Rx / DC Orders ED Discharge Orders         Ordered    HYDROcodone-acetaminophen (NORCO/VICODIN) 5-325 MG tablet  Every 4 hours PRN     05/12/19 [place], Maame Dack K, [place] 05/12/19 1730    Tegeler, Gwenyth Allegra, MD 05/13/19 1048

## 2021-02-13 ENCOUNTER — Encounter (HOSPITAL_BASED_OUTPATIENT_CLINIC_OR_DEPARTMENT_OTHER): Payer: Self-pay | Admitting: *Deleted

## 2021-02-13 ENCOUNTER — Emergency Department (HOSPITAL_BASED_OUTPATIENT_CLINIC_OR_DEPARTMENT_OTHER): Payer: Commercial Managed Care - PPO

## 2021-02-13 ENCOUNTER — Emergency Department (HOSPITAL_BASED_OUTPATIENT_CLINIC_OR_DEPARTMENT_OTHER)
Admission: EM | Admit: 2021-02-13 | Discharge: 2021-02-14 | Disposition: A | Discharge status: Home / Self Care | Payer: Commercial Managed Care - PPO | Attending: Emergency Medicine | Admitting: Emergency Medicine

## 2021-02-13 ENCOUNTER — Other Ambulatory Visit: Payer: Self-pay

## 2021-02-13 DIAGNOSIS — R109 Unspecified abdominal pain: Secondary | ICD-10-CM | POA: Insufficient documentation

## 2021-02-13 DIAGNOSIS — R079 Chest pain, unspecified: Secondary | ICD-10-CM | POA: Diagnosis present

## 2021-02-13 DIAGNOSIS — Z87891 Personal history of nicotine dependence: Secondary | ICD-10-CM | POA: Diagnosis not present

## 2021-02-13 DIAGNOSIS — Z20822 Contact with and (suspected) exposure to covid-19: Secondary | ICD-10-CM | POA: Insufficient documentation

## 2021-02-13 DIAGNOSIS — R0789 Other chest pain: Secondary | ICD-10-CM | POA: Diagnosis not present

## 2021-02-13 DIAGNOSIS — R112 Nausea with vomiting, unspecified: Secondary | ICD-10-CM | POA: Diagnosis not present

## 2021-02-13 DIAGNOSIS — R110 Nausea: Secondary | ICD-10-CM

## 2021-02-13 LAB — CBC WITH DIFFERENTIAL/PLATELET
Abs Immature Granulocytes: 0.03 10*3/uL (ref 0.00–0.07)
Basophils Absolute: 0.1 10*3/uL (ref 0.0–0.1)
Basophils Relative: 1 %
Eosinophils Absolute: 0.1 10*3/uL (ref 0.0–0.5)
Eosinophils Relative: 1 %
HCT: 39.1 % (ref 36.0–46.0)
Hemoglobin: 13.4 g/dL (ref 12.0–15.0)
Immature Granulocytes: 0 %
Lymphocytes Relative: 29 %
Lymphs Abs: 3.2 10*3/uL (ref 0.7–4.0)
MCH: 31 pg (ref 26.0–34.0)
MCHC: 34.3 g/dL (ref 30.0–36.0)
MCV: 90.5 fL (ref 80.0–100.0)
Monocytes Absolute: 0.9 10*3/uL (ref 0.1–1.0)
Monocytes Relative: 8 %
Neutro Abs: 6.8 10*3/uL (ref 1.7–7.7)
Neutrophils Relative %: 61 %
Platelets: 270 10*3/uL (ref 150–400)
RBC: 4.32 MIL/uL (ref 3.87–5.11)
RDW: 12.2 % (ref 11.5–15.5)
WBC: 11 10*3/uL — ABNORMAL HIGH (ref 4.0–10.5)
nRBC: 0 % (ref 0.0–0.2)

## 2021-02-13 LAB — URINALYSIS, ROUTINE W REFLEX MICROSCOPIC
Bilirubin Urine: NEGATIVE
Glucose, UA: NEGATIVE mg/dL
Ketones, ur: NEGATIVE mg/dL
Leukocytes,Ua: NEGATIVE
Nitrite: NEGATIVE
Protein, ur: NEGATIVE mg/dL
Specific Gravity, Urine: 1.025 (ref 1.005–1.030)
pH: 6 (ref 5.0–8.0)

## 2021-02-13 LAB — COMPREHENSIVE METABOLIC PANEL
ALT: 19 U/L (ref 0–44)
AST: 30 U/L (ref 15–41)
Albumin: 3.8 g/dL (ref 3.5–5.0)
Alkaline Phosphatase: 49 U/L (ref 38–126)
Anion gap: 7 (ref 5–15)
BUN: 15 mg/dL (ref 6–20)
CO2: 23 mmol/L (ref 22–32)
Calcium: 8.5 mg/dL — ABNORMAL LOW (ref 8.9–10.3)
Chloride: 107 mmol/L (ref 98–111)
Creatinine, Ser: 0.65 mg/dL (ref 0.44–1.00)
GFR, Estimated: >60 mL/min (ref >60)
Glucose, Bld: 94 mg/dL (ref 70–99)
Potassium: 3.7 mmol/L (ref 3.5–5.1)
Sodium: 137 mmol/L (ref 135–145)
Total Bilirubin: 0.9 mg/dL (ref 0.3–1.2)
Total Protein: 7.1 g/dL (ref 6.5–8.1)

## 2021-02-13 LAB — TROPONIN I (HIGH SENSITIVITY)
Troponin I (High Sensitivity): <2 ng/L (ref <18)
Troponin I (High Sensitivity): <2 ng/L (ref <18)

## 2021-02-13 LAB — URINALYSIS, MICROSCOPIC (REFLEX)

## 2021-02-13 LAB — LIPASE, BLOOD: Lipase: 37 U/L (ref 11–51)

## 2021-02-13 LAB — PREGNANCY, URINE: Preg Test, Ur: NEGATIVE

## 2021-02-13 MED ORDER — SODIUM CHLORIDE 0.9 % IV BOLUS
1000.0000 mL | Freq: Once | INTRAVENOUS | Status: AC
  Administered 2021-02-13: 1000 mL via INTRAVENOUS

## 2021-02-13 MED ORDER — IOHEXOL 350 MG/ML SOLN
100.0000 mL | Freq: Once | INTRAVENOUS | Status: AC | PRN
  Administered 2021-02-13: 100 mL via INTRAVENOUS

## 2021-02-13 MED ORDER — FENTANYL CITRATE PF 50 MCG/ML IJ SOSY
50.0000 ug | PREFILLED_SYRINGE | Freq: Once | INTRAMUSCULAR | Status: AC
  Administered 2021-02-13: 50 ug via INTRAVENOUS
  Filled 2021-02-13: qty 1

## 2021-02-13 MED ORDER — ONDANSETRON HCL 4 MG PO TABS: 4.0000 mg | ORAL_TABLET | Freq: Four times a day (QID) | ORAL | 0 refills | Status: AC

## 2021-02-13 MED ORDER — ONDANSETRON HCL 4 MG/2ML IJ SOLN
4.0000 mg | Freq: Once | INTRAMUSCULAR | Status: AC
  Administered 2021-02-13: 4 mg via INTRAVENOUS
  Filled 2021-02-13: qty 2

## 2021-02-13 MED ORDER — SODIUM CHLORIDE 0.9 % IV BOLUS
500.0000 mL | Freq: Once | INTRAVENOUS | Status: AC
  Administered 2021-02-13: 500 mL via INTRAVENOUS

## 2021-02-13 NOTE — ED Provider Notes (Signed)
[facility] EMERGENCY DEPARTMENT Provider Note   CSN: [id number] Arrival date & time: 02/13/21  1954     History Chief Complaint  Patient presents with   Chest Pain    Nicole Molina is a 40 y.o. female.  The history is provided by the patient.  Chest Pain Pain location:  R chest Pain quality: aching   Pain radiates to:  Mid back and lower back (abdomen, right flank) Pain severity:  Moderate Onset quality:  Gradual Duration:  2 hours Timing:  Constant Progression:  Unchanged Chronicity:  New Context: at rest   Relieved by:  Nothing Worsened by:  Nothing Associated symptoms: abdominal pain   Associated symptoms: no back pain, no cough, no fever, no palpitations, no shortness of breath and no vomiting   Risk factors: no birth control, no coronary artery disease, no high cholesterol, no hypertension and no prior DVT/PE   Risk factors comment:  Kidney stone     Past Medical History:  Diagnosis Date   Hx of varicella    Kidney stones     Patient Active Problem List   Diagnosis Date Noted   Pregnancy 07/22/2015    Past Surgical History:  Procedure Laterality Date   CHOLECYSTECTOMY       OB History     Gravida  4   Para  4   Term  4   Preterm  0   AB  0   Living  4      SAB  0   IAB  0   Ectopic  0   Multiple  0   Live Births  4           No family history on file.  Social History   Tobacco Use   Smoking status: Former    Packs/day: 0.50    Types: Cigarettes   Smokeless tobacco: Never  Substance Use Topics   Alcohol use: No   Drug use: No    Home Medications Prior to Admission medications   Medication Sig Start Date End Date Taking? Authorizing Provider  ondansetron (ZOFRAN) 4 MG tablet Take 1 tablet (4 mg total) by mouth every 6 (six) hours. 02/13/21  Yes Andrianna Manalang, DO  cyclobenzaprine (FLEXERIL) 10 MG tablet Take 10 mg by mouth.    [provider]  ferrous sulfate (FERROUSUL) 325 (65 FE) MG tablet  Take 1 tablet (325 mg total) by mouth daily with breakfast. 07/24/15   Richarda Overlie, MD  HYDROcodone-acetaminophen (NORCO/VICODIN) 5-325 MG tablet Take 1 tablet by mouth every 4 (four) hours as needed. 05/12/19   Elson Areas, PA-C  ibuprofen (ADVIL,MOTRIN) 600 MG tablet Take 1 tablet (600 mg total) by mouth every 6 (six) hours as needed for moderate pain. 07/24/15   Richarda Overlie, MD  levonorgestrel (MIRENA) 20 MCG/24HR IUD 1 each by Intrauterine route once.    [provider]  oxyCODONE-acetaminophen (PERCOCET/ROXICET) 5-325 MG tablet Take 1 tablet by mouth every 4 (four) hours as needed (pain scale 4-7). 07/24/15   Richarda Overlie, MD  pantoprazole (PROTONIX) 40 MG tablet Take 40 mg by mouth daily.    [provider]  zolpidem (AMBIEN) 10 MG tablet Take 10 mg by mouth at bedtime as needed for sleep.    [provider]    Allergies    Patient has no known allergies.  Review of Systems   Review of Systems  Constitutional:  Negative for chills and fever.  HENT:  Negative for ear pain and  sore throat.   Eyes:  Negative for pain and visual disturbance.  Respiratory:  Negative for cough and shortness of breath.   Cardiovascular:  Positive for chest pain. Negative for palpitations.  Gastrointestinal:  Positive for abdominal pain. Negative for vomiting.  Genitourinary:  Negative for dysuria and hematuria.  Musculoskeletal:  Negative for arthralgias and back pain.  Skin:  Negative for color change and rash.  Neurological:  Negative for seizures and syncope.  All other systems reviewed and are negative.  Physical Exam Updated Vital Signs BP 101/62   Pulse 66   Temp 98.1 F (36.7 C) (Oral)   Resp 18   Ht 5' (1.524 m)   Wt 74.4 kg   LMP 02/11/2021   SpO2 100%   Breastfeeding No   BMI 32.03 kg/m   Physical Exam Vitals and nursing note reviewed.  Constitutional:      General: She is in acute distress.     Appearance: She is well-developed. She is  ill-appearing.  HENT:     Head: Normocephalic and atraumatic.  Eyes:     Conjunctiva/sclera: Conjunctivae normal.     Pupils: Pupils are equal, round, and reactive to light.  Cardiovascular:     Rate and Rhythm: Normal rate and regular rhythm.     Pulses:          Radial pulses are 2+ on the right side and 2+ on the left side.     Heart sounds: Normal heart sounds. No murmur heard. Pulmonary:     Effort: Pulmonary effort is normal. No respiratory distress.     Breath sounds: Normal breath sounds. No decreased breath sounds, wheezing or rhonchi.  Abdominal:     Palpations: Abdomen is soft.     Tenderness: There is abdominal tenderness (right flank).  Musculoskeletal:        General: Normal range of motion.     Cervical back: Normal range of motion and neck supple.     Right lower leg: No edema.     Left lower leg: No edema.  Skin:    General: Skin is warm and dry.     Capillary Refill: Capillary refill takes less than 2 seconds.  Neurological:     General: No focal deficit present.     Mental Status: She is alert.  Psychiatric:        Mood and Affect: Mood normal.    ED Results / Procedures / Treatments   Labs (all labs ordered are listed, but only abnormal results are displayed) Labs Reviewed  CBC WITH DIFFERENTIAL/PLATELET - Abnormal; Notable for the following components:      Result Value   WBC 11.0 (*)    All other components within normal limits  COMPREHENSIVE METABOLIC PANEL - Abnormal; Notable for the following components:   Calcium 8.5 (*)    All other components within normal limits  URINALYSIS, ROUTINE W REFLEX MICROSCOPIC - Abnormal; Notable for the following components:   APPearance HAZY (*)    Hgb urine dipstick MODERATE (*)    All other components within normal limits  URINALYSIS, MICROSCOPIC (REFLEX) - Abnormal; Notable for the following components:   Bacteria, UA FEW (*)    All other components within normal limits  RESP PANEL BY RT-PCR (FLU A&B,  COVID) ARPGX2  LIPASE, BLOOD  PREGNANCY, URINE  TROPONIN I (HIGH SENSITIVITY)  TROPONIN I (HIGH SENSITIVITY)    EKG EKG Interpretation  Date/Time:  Sunday February 13 2021 20:01:06 EDT Ventricular Rate:  73 PR Interval:  138 QRS Duration: 80 QT Interval:  402 QTC Calculation: 442 R Axis:   33 Text Interpretation: Normal sinus rhythm Low voltage QRS Borderline ECG Confirmed by Virgina Norfolk ([id number]) on 02/13/2021 8:11:57 PM  Radiology CT Angio Chest/Abd/Pel for Dissection W and/or W/WO  Result Date: 02/13/2021 CLINICAL DATA:  Pain. Mid chest pain radiating to the right side since this afternoon. Vomiting. Dizziness. EXAM: CT ANGIOGRAPHY CHEST, ABDOMEN AND PELVIS TECHNIQUE: Non-contrast CT of the chest was initially obtained. Multidetector CT imaging through the chest, abdomen and pelvis was performed using the standard protocol during bolus administration of intravenous contrast. Multiplanar reconstructed images and MIPs were obtained and reviewed to evaluate the vascular anatomy. CONTRAST:  OMNIPAQUE IOHEXOL 350 MG/ML SOLN COMPARISON:  CT chest 05/12/2019.  CT abdomen and pelvis 08/06/2014 FINDINGS: CTA CHEST FINDINGS Cardiovascular: Unenhanced images of the chest demonstrate no significant vascular calcifications. No evidence of intramural hematoma in the thoracic aorta. Surgical clips in the right upper quadrant. Images obtained during the arterial phase after intravenous contrast administration demonstrate normal caliber thoracic aorta. No aneurysm or dissection. Great vessel origins are patent. Central pulmonary arteries are moderately well opacified without evidence of significant central pulmonary embolus. Heart size is normal. No pericardial effusions. Mediastinum/Nodes: No enlarged mediastinal, hilar, or axillary lymph nodes. Thyroid gland, trachea, and esophagus demonstrate no significant findings. Lungs/Pleura: Mild dependent changes in the lung bases. No airspace disease or  consolidation. No pleural effusions. No pneumothorax. Airways are patent. Musculoskeletal: No chest wall abnormality. No acute or significant osseous findings. Review of the MIP images confirms the above findings. CTA ABDOMEN AND PELVIS FINDINGS VASCULAR Aorta: Normal caliber aorta without aneurysm, dissection, vasculitis or significant stenosis. Celiac: Patent without evidence of aneurysm, dissection, vasculitis or significant stenosis. SMA: Patent without evidence of aneurysm, dissection, vasculitis or significant stenosis. Renals: Both renal arteries are patent without evidence of aneurysm, dissection, vasculitis, fibromuscular dysplasia or significant stenosis. IMA: Patent without evidence of aneurysm, dissection, vasculitis or significant stenosis. Inflow: Patent without evidence of aneurysm, dissection, vasculitis or significant stenosis. Veins: No obvious venous abnormality within the limitations of this arterial phase study. Review of the MIP images confirms the above findings. NON-VASCULAR Hepatobiliary: Surgical absence of the gallbladder. Mild bile duct dilatation is likely normal for postoperative physiology. No focal liver lesions. Pancreas: Unremarkable. No pancreatic ductal dilatation or surrounding inflammatory changes. Spleen: Normal in size without focal abnormality. Adrenals/Urinary Tract: No adrenal gland nodules. Cyst in the left kidney unchanged since prior study. Stone in the midportion of the right kidney measuring 3 mm diameter. No hydronephrosis or hydroureter. Bladder is unremarkable. Stomach/Bowel: Stomach, small bowel, and colon are not abnormally distended. No wall thickening or inflammatory changes are appreciated. Appendix is not identified. Lymphatic: No significant lymphadenopathy. Scattered lymph nodes are not pathologically enlarged, likely reactive. Reproductive: Uterus is retroverted without enlargement. Intrauterine device is present. Ovaries are not enlarged. Other: No free  air or free fluid in the abdomen. Abdominal wall musculature appears intact. Musculoskeletal: No acute or significant osseous findings. Review of the MIP images confirms the above findings. IMPRESSION: 1. No evidence of aneurysm or dissection involving the thoracic or abdominal aorta. 2. No evidence of active pulmonary disease. 3. No acute process demonstrated in the abdomen or pelvis. No evidence of bowel obstruction or inflammation. Electronically Signed   By: Burman Nieves M.D.   On: 02/13/2021 22:28    Procedures Procedures   Medications Ordered in ED Medications  sodium chloride 0.9 % bolus 1,000 mL (0 mLs Intravenous  Stopped 02/13/21 2145)  ondansetron (ZOFRAN) injection 4 mg (4 mg Intravenous Given 02/13/21 2037)  fentaNYL (SUBLIMAZE) injection 50 mcg (50 mcg Intravenous Given 02/13/21 2037)  sodium chloride 0.9 % bolus 500 mL (0 mLs Intravenous Stopped 02/13/21 2302)  iohexol (OMNIPAQUE) 350 MG/ML injection 100 mL (100 mLs Intravenous Contrast Given 02/13/21 2209)  sodium chloride 0.9 % bolus 500 mL (500 mLs Intravenous New Bag/Given 02/13/21 2348)    ED Course  I have reviewed the triage vital signs and the nursing notes.  Pertinent labs & imaging results that were available during my care of the patient were reviewed by me and considered in my medical decision making (see chart for details).    MDM Rules/Calculators/A&P                           Nicole Molina is here with chest pain, abdominal pain, nausea, vomiting.  History of kidney stones.  Patient had fairly sudden right-sided chest pain that radiated to her back and down her right side of her body.  She denies any headache.  She has had some episodes of nausea and vomiting.  Denies any fevers or chills.  She woke up this morning just not feeling very well.  Has not had anything to eat and drink all day.  Denies any strenuous activities.  EKG shows sinus rhythm.  No ischemic changes.  She appears very uncomfortable on exam.  Pain  may be slightly reproducible.  Blood pressure is mostly in the 90s.  Concern for possibly dissection versus kidney stone versus pancreatitis versus ACS.  Seems less likely to be PE as she is PERC negative.  Lab work obtained including troponins.  We will get dissection study.  Patient feeling better after IV fentanyl, IV fluids, IV Zofran.  However did have an episode of hypotension.  This did appear to improve when blood pressure cuff was changed.  She is given additional fluid bolus.  Troponins are negative x2.  Dissection study is normal.  No PE, no dissection, no kidney stone, no pancreatitis.  She has no significant leukocytosis, anemia, electrolyte [place], kidney injury.  Urinalysis negative for infection.  Overall suspect may be muscular process versus viral process.  She denies any diarrhea.  No sick contacts.  Will let patient finish fluid bolus and let her eat and drink and reevaluate.  Patient handed off to oncoming staff pending reevaluation.  Please see their note for further results, evaluation, disposition of patient.  Will prescribe Zofran.  This chart was dictated using voice recognition software.  Despite best efforts to proofread,  errors can occur which can change the documentation meaning.   Final Clinical Impression(s) / ED Diagnoses Final diagnoses:  Chest pain, unspecified type  Nausea  Abdominal pain, unspecified abdominal location    Rx / DC Orders ED Discharge Orders          Ordered    ondansetron (ZOFRAN) 4 MG tablet  Every 6 hours        02/13/21 2355             Virgina Norfolk, DO 02/13/21 2355

## 2021-02-13 NOTE — ED Triage Notes (Addendum)
Mid chest pain, radiating to right side of chest since this afternoon. States she feels dizzy like she is going to pass out. Vomited x 2 today and currently having dry heaves

## 2021-02-14 LAB — RESP PANEL BY RT-PCR (FLU A&B, COVID) ARPGX2
Influenza A by PCR: NEGATIVE
Influenza B by PCR: NEGATIVE
SARS Coronavirus 2 by RT PCR: NEGATIVE

## 2021-02-14 NOTE — ED Provider Notes (Signed)
Patient signed out to me by Dr. Lockie Mola.  Patient seen with right-sided chest pain earlier tonight.  Pain has since resolved.  Work-up has been appropriate and negative.  Cardiac enzymes negative.  EKG nonconcerning.  CT angio chest without any current findings.  Patient became mildly hypotensive after fentanyl.  She has been given IV fluids and remains slightly hypotensive, but has been ambulatory without symptoms.  I do not see any further work-up that could be performed.  Patient will be discharged, will schedule follow-up with primary care.  Given return precautions.   Gilda Crease, MD 02/14/21 [phone]

## 2021-10-29 ENCOUNTER — Encounter (HOSPITAL_BASED_OUTPATIENT_CLINIC_OR_DEPARTMENT_OTHER): Payer: Self-pay | Admitting: Emergency Medicine

## 2021-10-29 ENCOUNTER — Emergency Department (HOSPITAL_BASED_OUTPATIENT_CLINIC_OR_DEPARTMENT_OTHER)
Admission: EM | Admit: 2021-10-29 | Discharge: 2021-10-29 | Disposition: A | Discharge status: Home / Self Care | Payer: Commercial Managed Care - PPO | Attending: Emergency Medicine | Admitting: Emergency Medicine

## 2021-10-29 ENCOUNTER — Emergency Department (HOSPITAL_BASED_OUTPATIENT_CLINIC_OR_DEPARTMENT_OTHER): Payer: Commercial Managed Care - PPO

## 2021-10-29 ENCOUNTER — Other Ambulatory Visit: Payer: Self-pay

## 2021-10-29 DIAGNOSIS — J029 Acute pharyngitis, unspecified: Secondary | ICD-10-CM | POA: Diagnosis present

## 2021-10-29 DIAGNOSIS — Z20822 Contact with and (suspected) exposure to covid-19: Secondary | ICD-10-CM | POA: Insufficient documentation

## 2021-10-29 DIAGNOSIS — J039 Acute tonsillitis, unspecified: Secondary | ICD-10-CM | POA: Insufficient documentation

## 2021-10-29 DIAGNOSIS — B349 Viral infection, unspecified: Secondary | ICD-10-CM | POA: Diagnosis not present

## 2021-10-29 LAB — CBC WITH DIFFERENTIAL/PLATELET
Abs Immature Granulocytes: 0.04 10*3/uL (ref 0.00–0.07)
Basophils Absolute: 0 10*3/uL (ref 0.0–0.1)
Basophils Relative: 0 %
Eosinophils Absolute: 0 10*3/uL (ref 0.0–0.5)
Eosinophils Relative: 0 %
HCT: 40.1 % (ref 36.0–46.0)
Hemoglobin: 13.6 g/dL (ref 12.0–15.0)
Immature Granulocytes: 0 %
Lymphocytes Relative: 11 %
Lymphs Abs: 1.1 10*3/uL (ref 0.7–4.0)
MCH: 30.4 pg (ref 26.0–34.0)
MCHC: 33.9 g/dL (ref 30.0–36.0)
MCV: 89.5 fL (ref 80.0–100.0)
Monocytes Absolute: 0.9 10*3/uL (ref 0.1–1.0)
Monocytes Relative: 9 %
Neutro Abs: 7.9 10*3/uL — ABNORMAL HIGH (ref 1.7–7.7)
Neutrophils Relative %: 80 %
Platelets: 215 10*3/uL (ref 150–400)
RBC: 4.48 MIL/uL (ref 3.87–5.11)
RDW: 12.8 % (ref 11.5–15.5)
WBC: 10 10*3/uL (ref 4.0–10.5)
nRBC: 0 % (ref 0.0–0.2)

## 2021-10-29 LAB — URINALYSIS, ROUTINE W REFLEX MICROSCOPIC
Bilirubin Urine: NEGATIVE
Glucose, UA: NEGATIVE mg/dL
Ketones, ur: NEGATIVE mg/dL
Leukocytes,Ua: NEGATIVE
Nitrite: NEGATIVE
Protein, ur: NEGATIVE mg/dL
Specific Gravity, Urine: 1.02 (ref 1.005–1.030)
pH: 8.5 — ABNORMAL HIGH (ref 5.0–8.0)

## 2021-10-29 LAB — COMPREHENSIVE METABOLIC PANEL
ALT: 28 U/L (ref 0–44)
AST: 24 U/L (ref 15–41)
Albumin: 4 g/dL (ref 3.5–5.0)
Alkaline Phosphatase: 47 U/L (ref 38–126)
Anion gap: 8 (ref 5–15)
BUN: 10 mg/dL (ref 6–20)
CO2: 21 mmol/L — ABNORMAL LOW (ref 22–32)
Calcium: 8.8 mg/dL — ABNORMAL LOW (ref 8.9–10.3)
Chloride: 104 mmol/L (ref 98–111)
Creatinine, Ser: 0.63 mg/dL (ref 0.44–1.00)
GFR, Estimated: >60 mL/min (ref >60)
Glucose, Bld: 106 mg/dL — ABNORMAL HIGH (ref 70–99)
Potassium: 3.5 mmol/L (ref 3.5–5.1)
Sodium: 133 mmol/L — ABNORMAL LOW (ref 135–145)
Total Bilirubin: 1 mg/dL (ref 0.3–1.2)
Total Protein: 8 g/dL (ref 6.5–8.1)

## 2021-10-29 LAB — URINALYSIS, MICROSCOPIC (REFLEX)
Bacteria, UA: NONE SEEN
WBC, UA: NONE SEEN WBC/hpf (ref 0–5)

## 2021-10-29 LAB — PREGNANCY, URINE: Preg Test, Ur: NEGATIVE

## 2021-10-29 LAB — GROUP A STREP BY PCR: Group A Strep by PCR: NOT DETECTED

## 2021-10-29 LAB — LACTIC ACID, PLASMA: Lactic Acid, Venous: 1 mmol/L (ref 0.5–1.9)

## 2021-10-29 LAB — SARS CORONAVIRUS 2 BY RT PCR: SARS Coronavirus 2 by RT PCR: NEGATIVE

## 2021-10-29 MED ORDER — ACETAMINOPHEN 500 MG PO TABS
1000.0000 mg | ORAL_TABLET | Freq: Once | ORAL | Status: DC | PRN
  Filled 2021-10-29: qty 2

## 2021-10-29 MED ORDER — ONDANSETRON HCL 4 MG/2ML IJ SOLN
4.0000 mg | Freq: Once | INTRAMUSCULAR | Status: AC
  Administered 2021-10-29: 4 mg via INTRAVENOUS
  Filled 2021-10-29: qty 2

## 2021-10-29 MED ORDER — LIDOCAINE VISCOUS HCL 2 % MT SOLN: 15.0000 mL | OROMUCOSAL | 0 refills | Status: DC | PRN

## 2021-10-29 MED ORDER — ONDANSETRON 4 MG PO TBDP: 4.0000 mg | ORAL_TABLET | Freq: Three times a day (TID) | ORAL | 0 refills | Status: DC | PRN

## 2021-10-29 MED ORDER — LIDOCAINE VISCOUS HCL 2 % MT SOLN
15.0000 mL | Freq: Once | OROMUCOSAL | Status: AC
  Administered 2021-10-29: 15 mL via OROMUCOSAL
  Filled 2021-10-29: qty 15

## 2021-10-29 MED ORDER — KETOROLAC TROMETHAMINE 15 MG/ML IJ SOLN
15.0000 mg | Freq: Once | INTRAMUSCULAR | Status: AC
  Administered 2021-10-29: 15 mg via INTRAVENOUS
  Filled 2021-10-29: qty 1

## 2021-10-29 MED ORDER — SODIUM CHLORIDE 0.9 % IV BOLUS
1000.0000 mL | Freq: Once | INTRAVENOUS | Status: AC
  Administered 2021-10-29: 1000 mL via INTRAVENOUS

## 2021-10-29 MED ORDER — DEXAMETHASONE SODIUM PHOSPHATE 10 MG/ML IJ SOLN
10.0000 mg | Freq: Once | INTRAMUSCULAR | Status: AC
  Administered 2021-10-29: 10 mg via INTRAVENOUS
  Filled 2021-10-29: qty 1

## 2021-10-29 NOTE — ED Provider Notes (Signed)
[facility] EMERGENCY DEPARTMENT Provider Note   CSN: [id number] Arrival date & time: 10/29/21  1821     History  Chief Complaint  Patient presents with   Sore Throat   Fever    Nicole Molina is a 41 y.o. female who presents emergency department complaining of central chest pain, fever, cough, sore throat, and bilateral neck swelling starting this morning.  Patient states that she woke up and was having difficulty swallowing, and felt as though her tonsils were swollen.  She tried taking some ibuprofen, but it gave her no relief.  She also took tablets of amoxicillin that they had at the house.   Sore Throat Associated symptoms include chest pain, abdominal pain and shortness of breath.  Fever Associated symptoms: chest pain, chills, congestion, nausea, sore throat and vomiting   Associated symptoms: no diarrhea        Home Medications Prior to Admission medications   Medication Sig Start Date End Date Taking? Authorizing Provider  lidocaine (XYLOCAINE) 2 % solution Use as directed 15 mLs in the mouth or throat as needed (throat pain). 10/29/21  Yes Rossanna Spitzley T, PA-C  ondansetron (ZOFRAN-ODT) 4 MG disintegrating tablet Take 1 tablet (4 mg total) by mouth every 8 (eight) hours as needed for nausea or vomiting. 10/29/21  Yes Kollins Fenter T, PA-C  cyclobenzaprine (FLEXERIL) 10 MG tablet Take 10 mg by mouth.    [provider]  ferrous sulfate (FERROUSUL) 325 (65 FE) MG tablet Take 1 tablet (325 mg total) by mouth daily with breakfast. 07/24/15   Richarda Overlie, MD  HYDROcodone-acetaminophen (NORCO/VICODIN) 5-325 MG tablet Take 1 tablet by mouth every 4 (four) hours as needed. 05/12/19   Elson Areas, PA-C  ibuprofen (ADVIL,MOTRIN) 600 MG tablet Take 1 tablet (600 mg total) by mouth every 6 (six) hours as needed for moderate pain. 07/24/15   Richarda Overlie, MD  levonorgestrel (MIRENA) 20 MCG/24HR IUD 1 each by Intrauterine route once.    [provider]  ondansetron (ZOFRAN) 4 MG tablet Take 1 tablet (4 mg total) by mouth every 6 (six) hours. 02/13/21   Curatolo, Adam, DO  oxyCODONE-acetaminophen (PERCOCET/ROXICET) 5-325 MG tablet Take 1 tablet by mouth every 4 (four) hours as needed (pain scale 4-7). 07/24/15   Richarda Overlie, MD  pantoprazole (PROTONIX) 40 MG tablet Take 40 mg by mouth daily.    [provider]  zolpidem (AMBIEN) 10 MG tablet Take 10 mg by mouth at bedtime as needed for sleep.    [provider]      Allergies    Patient has no known allergies.    Review of Systems   Review of Systems  Constitutional:  Positive for chills and fever.  HENT:  Positive for congestion, sore throat and trouble swallowing.   Respiratory:  Positive for shortness of breath.   Cardiovascular:  Positive for chest pain. Negative for leg swelling.  Gastrointestinal:  Positive for abdominal pain, nausea and vomiting. Negative for diarrhea.  All other systems reviewed and are negative.   Physical Exam Updated Vital Signs BP 112/80   Pulse (!) 104   Temp 98.4 F (36.9 C) (Oral)   Resp 17   Ht 5' (1.524 m)   Wt 76.2 kg   SpO2 95%   BMI 32.81 kg/m  Physical Exam Vitals and nursing note reviewed.  Constitutional:      Appearance: Normal appearance.  HENT:     Head: Normocephalic and atraumatic.     Nose:  Congestion present.     Mouth/Throat:     Lips: Pink.     Mouth: Mucous membranes are moist.     Pharynx: Uvula midline. Posterior oropharyngeal erythema present.     Tonsils: No tonsillar exudate or tonsillar abscesses. 3+ on the right.     Comments: Tolerating her own secretions Eyes:     Conjunctiva/sclera: Conjunctivae normal.  Cardiovascular:     Rate and Rhythm: Normal rate and regular rhythm.  Pulmonary:     Effort: Pulmonary effort is normal. No respiratory distress.     Breath sounds: Normal breath sounds.  Abdominal:     General: There is no distension.     Palpations: Abdomen is soft.      Tenderness: There is no abdominal tenderness.  Skin:    General: Skin is warm and dry.  Neurological:     General: No focal deficit present.     Mental Status: She is alert.     ED Results / Procedures / Treatments   Labs (all labs ordered are listed, but only abnormal results are displayed) Labs Reviewed  COMPREHENSIVE METABOLIC PANEL - Abnormal; Notable for the following components:      Result Value   Sodium 133 (*)    CO2 21 (*)    Glucose, Bld 106 (*)    Calcium 8.8 (*)    All other components within normal limits  CBC WITH DIFFERENTIAL/PLATELET - Abnormal; Notable for the following components:   Neutro Abs 7.9 (*)    All other components within normal limits  URINALYSIS, ROUTINE W REFLEX MICROSCOPIC - Abnormal; Notable for the following components:   pH 8.5 (*)    Hgb urine dipstick TRACE (*)    All other components within normal limits  GROUP A STREP BY PCR  SARS CORONAVIRUS 2 BY RT PCR  LACTIC ACID, PLASMA  PREGNANCY, URINE  URINALYSIS, MICROSCOPIC (REFLEX)    EKG None  Radiology DG Chest Port 1 View  Result Date: 10/29/2021 CLINICAL DATA:  Chest pain with fever. EXAM: PORTABLE CHEST 1 VIEW COMPARISON:  Chest x-ray 05/12/2019 FINDINGS: The heart size and mediastinal contours are within normal limits. Both lungs are clear. The visualized skeletal structures are unremarkable. IMPRESSION: No active disease. Electronically Signed   By: Darliss Cheney M.D.   On: 10/29/2021 19:06    Procedures Procedures    Medications Ordered in ED Medications  acetaminophen (TYLENOL) tablet 1,000 mg (has no administration in time range)  lidocaine (XYLOCAINE) 2 % viscous mouth solution 15 mL (has no administration in time range)  sodium chloride 0.9 % bolus 1,000 mL (0 mLs Intravenous Stopped 10/29/21 2224)  ondansetron (ZOFRAN) injection 4 mg (4 mg Intravenous Given 10/29/21 1951)  ketorolac (TORADOL) 15 MG/ML injection 15 mg (15 mg Intravenous Given 10/29/21 1951)   dexamethasone (DECADRON) injection 10 mg (10 mg Intravenous Given 10/29/21 1951)    ED Course/ Medical Decision Making/ A&P                           Medical Decision Making Amount and/or Complexity of Data Reviewed Labs: ordered. Radiology: ordered.  Risk OTC drugs. Prescription drug management.   This patient is a 41 y.o. female  who presents to the ED for concern of sore throat and fever.   Differential diagnoses prior to evaluation: The emergent differential diagnosis includes, but is not limited to,  Viral pharyngitis, strep pharyngitis, dental caries/abscess, esophagitis, sinusitis, post nasal drip, reflux, angioedema, RTA/PTA,  Ludwig's angina. This is not an exhaustive differential.   Past Medical History / Co-morbidities: No significant PMH  Physical Exam: Physical exam performed. The pertinent findings include: Febrile to 103.2 F, and tachycardic to 131. Difficulty swallowing but tolerating own secretions. 3+ tonsillar swelling bilaterally without abscess or exudate. Normal respirations and oxygen saturation.   Lab Tests/Imaging studies: I personally interpreted labs/imaging and the pertinent results include:  No leukocytosis, normal hemoglobin. Sodium 133, normal kidney function. Urinalysis negative for infection. Lactic 1.0. Group A strep and COVID test negative. Chest x-ray without acute cardiopulmonary abnormalities. I agree with the radiologist interpretation.   Medications: I ordered medication including IV fluids, toradol, tylenol, zofran, decadron, and lidocaine viscous solution.  I have reviewed the patients home medicines and have made adjustments as needed. On reevaluation, patient's fever and tachycardia has resolved and she is feeling much better.    Disposition: After consideration of the diagnostic results and the patients response to treatment, I feel that emergency department workup does not suggest an emergent condition requiring admission or immediate  intervention beyond what has been performed at this time. Symptoms likely related to viral tonsillitis, will treat symptomatically with over the counter medications and zofran prescription. The patient is safe for discharge and has been instructed to return immediately for worsening symptoms, change in symptoms or any other concerns.   Final Clinical Impression(s) / ED Diagnoses Final diagnoses:  Viral illness  Tonsillitis    Rx / DC Orders ED Discharge Orders          Ordered    lidocaine (XYLOCAINE) 2 % solution  As needed        10/29/21 2241    ondansetron (ZOFRAN-ODT) 4 MG disintegrating tablet  Every 8 hours PRN        10/29/21 2241           Portions of this report may have been transcribed using voice recognition software. Every effort was made to ensure accuracy; however, inadvertent computerized transcription errors may be present.    Jeanella Flattery 10/30/21 1506    Charlynne Pander, MD 10/30/21 [phone]

## 2021-10-29 NOTE — ED Notes (Signed)
Pt currently vomiting. Unable to take PO acetaminophen at this time

## 2022-02-19 ENCOUNTER — Other Ambulatory Visit: Payer: Self-pay

## 2022-02-19 ENCOUNTER — Emergency Department (HOSPITAL_BASED_OUTPATIENT_CLINIC_OR_DEPARTMENT_OTHER)
Admission: EM | Admit: 2022-02-19 | Discharge: 2022-02-20 | Disposition: A | Discharge status: Home / Self Care | Payer: Commercial Managed Care - PPO | Attending: Emergency Medicine | Admitting: Emergency Medicine

## 2022-02-19 DIAGNOSIS — R109 Unspecified abdominal pain: Secondary | ICD-10-CM | POA: Diagnosis present

## 2022-02-19 DIAGNOSIS — R112 Nausea with vomiting, unspecified: Secondary | ICD-10-CM | POA: Diagnosis not present

## 2022-02-19 DIAGNOSIS — Z87891 Personal history of nicotine dependence: Secondary | ICD-10-CM | POA: Diagnosis not present

## 2022-02-19 LAB — CBC WITH DIFFERENTIAL/PLATELET
Abs Immature Granulocytes: 0.02 10*3/uL (ref 0.00–0.07)
Basophils Absolute: 0 10*3/uL (ref 0.0–0.1)
Basophils Relative: 0 %
Eosinophils Absolute: 0.1 10*3/uL (ref 0.0–0.5)
Eosinophils Relative: 1 %
HCT: 37.2 % (ref 36.0–46.0)
Hemoglobin: 12.4 g/dL (ref 12.0–15.0)
Immature Granulocytes: 0 %
Lymphocytes Relative: 37 %
Lymphs Abs: 3.8 10*3/uL (ref 0.7–4.0)
MCH: 30 pg (ref 26.0–34.0)
MCHC: 33.3 g/dL (ref 30.0–36.0)
MCV: 90.1 fL (ref 80.0–100.0)
Monocytes Absolute: 0.9 10*3/uL (ref 0.1–1.0)
Monocytes Relative: 9 %
Neutro Abs: 5.5 10*3/uL (ref 1.7–7.7)
Neutrophils Relative %: 53 %
Platelets: 278 10*3/uL (ref 150–400)
RBC: 4.13 MIL/uL (ref 3.87–5.11)
RDW: 12 % (ref 11.5–15.5)
WBC: 10.3 10*3/uL (ref 4.0–10.5)
nRBC: 0 % (ref 0.0–0.2)

## 2022-02-19 MED ORDER — SODIUM CHLORIDE 0.9 % IV BOLUS
1000.0000 mL | Freq: Once | INTRAVENOUS | Status: AC
  Administered 2022-02-20: 1000 mL via INTRAVENOUS

## 2022-02-19 MED ORDER — ONDANSETRON HCL 4 MG/2ML IJ SOLN
4.0000 mg | Freq: Once | INTRAMUSCULAR | Status: AC
  Administered 2022-02-20: 4 mg via INTRAVENOUS
  Filled 2022-02-19: qty 2

## 2022-02-19 MED ORDER — HYDROMORPHONE HCL 1 MG/ML IJ SOLN
1.0000 mg | Freq: Once | INTRAMUSCULAR | Status: AC
  Administered 2022-02-20: 1 mg via INTRAVENOUS
  Filled 2022-02-19: qty 1

## 2022-02-19 NOTE — ED Triage Notes (Signed)
Bilateral flank pain beginning this morning that has progressively worsened, N/V as well. No vomiting since taking zofran. PT reports only urinating once today, denies urge to urinate or feeling of full bladder. Pt reports no oral intake today. Denies fever. Hx of kidney stones.

## 2022-02-19 NOTE — ED Provider Notes (Incomplete)
[facility] Provider Note: Georgena Spurling, MD, FACEP  CSN: [id number] MRN: [id number] ARRIVAL: 02/19/22 at 2321 ROOM: [place]  Flank Pain   HISTORY OF PRESENT ILLNESS  02/19/22 11:44 PM Nicole Molina is a 41 y.o. female who developed bilateral flank pain this morning that is progressively worsened.  She has had nausea and vomiting with it as well but this has been controlled with Zofran which she took about 8 or 9 this morning.  She rates her pain as a 10 out of 10.  She has only urinated once today and denies any urge to urinate or feeling of a full bladder.  She has not eaten anything today.  She does have a history of kidney stones.    Past Medical History:  Diagnosis Date   Hx of varicella    Kidney stones     Past Surgical History:  Procedure Laterality Date   CHOLECYSTECTOMY      No family history on file.  Social History   Tobacco Use   Smoking status: Former    Packs/day: 0.50    Types: Cigarettes   Smokeless tobacco: Never  Substance Use Topics   Alcohol use: No   Drug use: No    Prior to Admission medications   Medication Sig Start Date End Date Taking? Authorizing Provider  oxyCODONE-acetaminophen (PERCOCET) 10-325 MG tablet Take 1 tablet by mouth every 6 (six) hours as needed for pain. 02/20/22  Yes Macon Lesesne, MD  cyclobenzaprine (FLEXERIL) 10 MG tablet Take 10 mg by mouth.    [provider]  ferrous sulfate (FERROUSUL) 325 (65 FE) MG tablet Take 1 tablet (325 mg total) by mouth daily with breakfast. 07/24/15   Molli Posey, MD  levonorgestrel (MIRENA) 20 MCG/24HR IUD 1 each by Intrauterine route once.    [provider]  lidocaine (XYLOCAINE) 2 % solution Use as directed 15 mLs in the mouth or throat as needed (throat pain). 10/29/21   Roemhildt, Lorin T, PA-C  ondansetron (ZOFRAN) 4 MG tablet Take 1 tablet (4 mg total) by mouth every 6 (six) hours. 02/13/21   Curatolo, Adam, DO  ondansetron  (ZOFRAN-ODT) 4 MG disintegrating tablet Take 1 tablet (4 mg total) by mouth every 8 (eight) hours as needed for nausea or vomiting. 10/29/21   Roemhildt, Lorin T, PA-C  pantoprazole (PROTONIX) 40 MG tablet Take 40 mg by mouth daily.    [provider]  zolpidem (AMBIEN) 10 MG tablet Take 10 mg by mouth at bedtime as needed for sleep.    [provider]    Allergies Patient has no known allergies.   REVIEW OF SYSTEMS  Negative except as noted here or in the History of Present Illness.   PHYSICAL EXAMINATION  Initial Vital Signs Blood pressure 111/81, pulse 74, temperature 97.8 F (36.6 C), temperature source Oral, resp. rate (!) 22, height 5\' 2"  (1.575 m), weight 77.9 kg, SpO2 96 %.  Examination General: Well-developed, well-nourished female in no acute distress; appearance consistent with age of record HENT: normocephalic; atraumatic Eyes: Normal appearance Neck: supple Heart: regular rate and rhythm Lungs: clear to auscultation bilaterally Abdomen: soft; nondistended; nontender; bowel sounds present GU: Bilateral CVA tenderness Extremities: No deformity; full range of motion; pulses normal Neurologic: Awake, alert and oriented; motor function intact in all extremities and symmetric; no facial droop Skin: Warm and dry Psychiatric: Grimacing   RESULTS  Summary of this visit's results, reviewed and interpreted by myself:   EKG Interpretation  Date/Time:    Ventricular Rate:    PR Interval:    QRS Duration:   QT Interval:    QTC Calculation:   R Axis:     Text Interpretation:         Laboratory Studies: Results for orders placed or performed during the hospital encounter of 02/19/22 (from the past 24 hour(s))  Basic metabolic panel     Status: Abnormal   Collection Time: 02/19/22 11:41 PM  Result Value Ref Range   Sodium 137 135 - 145 mmol/L   Potassium 3.8 3.5 - 5.1 mmol/L   Chloride 107 98 - 111 mmol/L   CO2 24 22 - 32 mmol/L   Glucose, Bld  103 (H) 70 - 99 mg/dL   BUN 14 6 - 20 mg/dL   Creatinine, Ser 0.65 0.44 - 1.00 mg/dL   Calcium 8.6 (L) 8.9 - 10.3 mg/dL   GFR, Estimated >60 >60 mL/min   Anion gap 6 5 - 15  CBC with Differential     Status: None   Collection Time: 02/19/22 11:41 PM  Result Value Ref Range   WBC 10.3 4.0 - 10.5 K/uL   RBC 4.13 3.87 - 5.11 MIL/uL   Hemoglobin 12.4 12.0 - 15.0 g/dL   HCT 37.2 36.0 - 46.0 %   MCV 90.1 80.0 - 100.0 fL   MCH 30.0 26.0 - 34.0 pg   MCHC 33.3 30.0 - 36.0 g/dL   RDW 12.0 11.5 - 15.5 %   Platelets 278 150 - 400 K/uL   nRBC 0.0 0.0 - 0.2 %   Neutrophils Relative % 53 %   Neutro Abs 5.5 1.7 - 7.7 K/uL   Lymphocytes Relative 37 %   Lymphs Abs 3.8 0.7 - 4.0 K/uL   Monocytes Relative 9 %   Monocytes Absolute 0.9 0.1 - 1.0 K/uL   Eosinophils Relative 1 %   Eosinophils Absolute 0.1 0.0 - 0.5 K/uL   Basophils Relative 0 %   Basophils Absolute 0.0 0.0 - 0.1 K/uL   Immature Granulocytes 0 %   Abs Immature Granulocytes 0.02 0.00 - 0.07 K/uL  Urinalysis, Routine w reflex microscopic Urine, Clean Catch     Status: Abnormal   Collection Time: 02/20/22 12:04 AM  Result Value Ref Range   Color, Urine YELLOW YELLOW   APPearance HAZY (A) CLEAR   Specific Gravity, Urine 1.025 1.005 - 1.030   pH 5.5 5.0 - 8.0   Glucose, UA NEGATIVE NEGATIVE mg/dL   Hgb urine dipstick TRACE (A) NEGATIVE   Bilirubin Urine NEGATIVE NEGATIVE   Ketones, ur NEGATIVE NEGATIVE mg/dL   Protein, ur NEGATIVE NEGATIVE mg/dL   Nitrite NEGATIVE NEGATIVE   Leukocytes,Ua NEGATIVE NEGATIVE  Urinalysis, Microscopic (reflex)     Status: Abnormal   Collection Time: 02/20/22 12:04 AM  Result Value Ref Range   RBC / HPF 0-5 0 - 5 RBC/hpf   WBC, UA 0-5 0 - 5 WBC/hpf   Bacteria, UA FEW (A) NONE SEEN   Squamous Epithelial / LPF 0-5 0 - 5   Mucus PRESENT   Pregnancy, urine     Status: None   Collection Time: 02/20/22 12:11 AM  Result Value Ref Range   Preg Test, Ur NEGATIVE NEGATIVE   Imaging Studies: CT ABDOMEN  PELVIS W CONTRAST  Result Date: 02/20/2022 CLINICAL DATA:  Abdominal pain.  Bilateral flank pain. EXAM: CT ABDOMEN AND PELVIS WITH CONTRAST TECHNIQUE: Multidetector CT imaging of the abdomen and pelvis was performed using the standard protocol following  bolus administration of intravenous contrast. RADIATION DOSE REDUCTION: This exam was performed according to the departmental dose-optimization program which includes automated exposure control, adjustment of the mA and/or kV according to patient size and/or use of iterative reconstruction technique. CONTRAST:  155mL OMNIPAQUE IOHEXOL 300 MG/ML  SOLN COMPARISON:  02/13/2021 FINDINGS: Lower chest: No acute abnormality Hepatobiliary: Prior cholecystectomy. Mild intrahepatic biliary ductal dilatation, likely related to post cholecystectomy state, decreasing since prior study. Common bile duct normal caliber. Pancreas: No focal abnormality or ductal dilatation. Spleen: No focal abnormality.  Normal size. Adrenals/Urinary Tract: Left upper pole renal cyst is again noted, unchanged. No follow-up imaging recommended. 4 mm non obstructing stone in the midpole of right kidney. No ureteral stones or hydronephrosis. Adrenal glands and urinary bladder unremarkable. Stomach/Bowel: Normal appendix. Stomach, large and small bowel grossly unremarkable. Vascular/Lymphatic: No evidence of aneurysm or adenopathy. Reproductive: Uterus and adnexa unremarkable. No mass. Retroverted uterus. IUD within the uterus. Other: No free fluid or free air. Musculoskeletal: No acute bony abnormality IMPRESSION: Right midpole nephrolithiasis. No ureteral stones or hydronephrosis. No acute findings in the abdomen or pelvis. Electronically Signed   By: Rolm Baptise M.D.   On: 02/20/2022 01:06    ED COURSE and MDM  Nursing notes, initial and subsequent vitals signs, including pulse oximetry, reviewed and interpreted by myself.  Vitals:   02/19/22 2331 02/19/22 2332 02/19/22 2345  BP: 111/81   106/69  Pulse: 74  73  Resp: (!) 22  18  Temp: 97.8 F (36.6 C)    TempSrc: Oral    SpO2: 96%  98%  Weight:  77.9 kg   Height:  5\' 2"  (1.575 m)    Medications  ketorolac (TORADOL) 15 MG/ML injection 15 mg (has no administration in time range)  sodium chloride 0.9 % bolus 1,000 mL (0 mLs Intravenous Stopped 02/20/22 0035)  ondansetron (ZOFRAN) injection 4 mg (4 mg Intravenous Given 02/20/22 0000)  HYDROmorphone (DILAUDID) injection 1 mg (1 mg Intravenous Given 02/20/22 0000)  iohexol (OMNIPAQUE) 300 MG/ML solution 100 mL (100 mLs Intravenous Contrast Given 02/20/22 0058)   1:15 AM Initial differential diagnosis was bilateral pyelonephritis, bilateral ureterolithiasis or some other intra-abdominal or retroperitoneal process mimicking these.  No such findings are seen on CT scan.  I suspect her pain is likely musculoskeletal or radicular.  Her laboratory studies are within normal limits.  There is no urinary tract infection.  We will treat her pain and have her follow-up with her PCP.  She states she has an adequate supply of Zofran at home.   PROCEDURES  Procedures   ED DIAGNOSES     ICD-10-CM   1. Bilateral flank pain  R10.9          Aras Albarran, MD 02/20/22 0121    Shanon Rosser, MD 02/20/22 [id number]

## 2022-02-20 ENCOUNTER — Encounter (HOSPITAL_BASED_OUTPATIENT_CLINIC_OR_DEPARTMENT_OTHER): Payer: Self-pay

## 2022-02-20 ENCOUNTER — Emergency Department (HOSPITAL_BASED_OUTPATIENT_CLINIC_OR_DEPARTMENT_OTHER): Payer: Commercial Managed Care - PPO

## 2022-02-20 LAB — BASIC METABOLIC PANEL
Anion gap: 6 (ref 5–15)
BUN: 14 mg/dL (ref 6–20)
CO2: 24 mmol/L (ref 22–32)
Calcium: 8.6 mg/dL — ABNORMAL LOW (ref 8.9–10.3)
Chloride: 107 mmol/L (ref 98–111)
Creatinine, Ser: 0.65 mg/dL (ref 0.44–1.00)
GFR, Estimated: >60 mL/min (ref >60)
Glucose, Bld: 103 mg/dL — ABNORMAL HIGH (ref 70–99)
Potassium: 3.8 mmol/L (ref 3.5–5.1)
Sodium: 137 mmol/L (ref 135–145)

## 2022-02-20 LAB — URINALYSIS, MICROSCOPIC (REFLEX)

## 2022-02-20 LAB — URINALYSIS, ROUTINE W REFLEX MICROSCOPIC
Bilirubin Urine: NEGATIVE
Glucose, UA: NEGATIVE mg/dL
Ketones, ur: NEGATIVE mg/dL
Leukocytes,Ua: NEGATIVE
Nitrite: NEGATIVE
Protein, ur: NEGATIVE mg/dL
Specific Gravity, Urine: 1.025 (ref 1.005–1.030)
pH: 5.5 (ref 5.0–8.0)

## 2022-02-20 LAB — PREGNANCY, URINE: Preg Test, Ur: NEGATIVE

## 2022-02-20 MED ORDER — IOHEXOL 300 MG/ML  SOLN
100.0000 mL | Freq: Once | INTRAMUSCULAR | Status: AC | PRN
  Administered 2022-02-20: 100 mL via INTRAVENOUS

## 2022-02-20 MED ORDER — OXYCODONE-ACETAMINOPHEN 10-325 MG PO TABS: 1.0000 | ORAL_TABLET | Freq: Four times a day (QID) | ORAL | 0 refills | Status: AC | PRN

## 2022-02-20 MED ORDER — KETOROLAC TROMETHAMINE 15 MG/ML IJ SOLN
15.0000 mg | Freq: Once | INTRAMUSCULAR | Status: AC
  Administered 2022-02-20: 15 mg via INTRAVENOUS
  Filled 2022-02-20: qty 1

## 2022-02-20 MED ORDER — SODIUM CHLORIDE 0.9 % IV BOLUS
1000.0000 mL | Freq: Once | INTRAVENOUS | Status: AC
  Administered 2022-02-20: 1000 mL via INTRAVENOUS

## 2022-02-20 NOTE — ED Notes (Signed)
Pt reports nausea has resolved.

## 2022-09-08 ENCOUNTER — Other Ambulatory Visit: Payer: Self-pay

## 2022-09-08 ENCOUNTER — Encounter (HOSPITAL_BASED_OUTPATIENT_CLINIC_OR_DEPARTMENT_OTHER): Payer: Self-pay | Admitting: Urology

## 2022-09-08 ENCOUNTER — Emergency Department (HOSPITAL_BASED_OUTPATIENT_CLINIC_OR_DEPARTMENT_OTHER)
Admission: EM | Admit: 2022-09-08 | Discharge: 2022-09-08 | Disposition: A | Discharge status: Home / Self Care | Payer: Commercial Managed Care - PPO | Attending: Emergency Medicine | Admitting: Emergency Medicine

## 2022-09-08 ENCOUNTER — Emergency Department (HOSPITAL_BASED_OUTPATIENT_CLINIC_OR_DEPARTMENT_OTHER): Payer: Commercial Managed Care - PPO

## 2022-09-08 DIAGNOSIS — Z87891 Personal history of nicotine dependence: Secondary | ICD-10-CM | POA: Insufficient documentation

## 2022-09-08 DIAGNOSIS — J189 Pneumonia, unspecified organism: Secondary | ICD-10-CM | POA: Insufficient documentation

## 2022-09-08 DIAGNOSIS — R079 Chest pain, unspecified: Secondary | ICD-10-CM | POA: Diagnosis present

## 2022-09-08 DIAGNOSIS — Z1152 Encounter for screening for COVID-19: Secondary | ICD-10-CM | POA: Diagnosis not present

## 2022-09-08 LAB — CBC
HCT: 40.2 % (ref 36.0–46.0)
Hemoglobin: 13.5 g/dL (ref 12.0–15.0)
MCH: 30.1 pg (ref 26.0–34.0)
MCHC: 33.6 g/dL (ref 30.0–36.0)
MCV: 89.5 fL (ref 80.0–100.0)
Platelets: 193 10*3/uL (ref 150–400)
RBC: 4.49 MIL/uL (ref 3.87–5.11)
RDW: 12.1 % (ref 11.5–15.5)
WBC: 5.2 10*3/uL (ref 4.0–10.5)
nRBC: 0 % (ref 0.0–0.2)

## 2022-09-08 LAB — PREGNANCY, URINE: Preg Test, Ur: NEGATIVE

## 2022-09-08 LAB — TROPONIN I (HIGH SENSITIVITY)
Troponin I (High Sensitivity): <2 ng/L (ref <18)
Troponin I (High Sensitivity): <2 ng/L (ref <18)

## 2022-09-08 LAB — RESP PANEL BY RT-PCR (RSV, FLU A&B, COVID)  RVPGX2
Influenza A by PCR: NEGATIVE
Influenza B by PCR: NEGATIVE
Resp Syncytial Virus by PCR: NEGATIVE
SARS Coronavirus 2 by RT PCR: NEGATIVE

## 2022-09-08 LAB — BASIC METABOLIC PANEL
Anion gap: 9 (ref 5–15)
BUN: 12 mg/dL (ref 6–20)
CO2: 25 mmol/L (ref 22–32)
Calcium: 8.6 mg/dL — ABNORMAL LOW (ref 8.9–10.3)
Chloride: 103 mmol/L (ref 98–111)
Creatinine, Ser: 0.69 mg/dL (ref 0.44–1.00)
GFR, Estimated: >60 mL/min (ref >60)
Glucose, Bld: 92 mg/dL (ref 70–99)
Potassium: 3.6 mmol/L (ref 3.5–5.1)
Sodium: 137 mmol/L (ref 135–145)

## 2022-09-08 MED ORDER — AMOXICILLIN-POT CLAVULANATE 875-125 MG PO TABS: 1.0000 | ORAL_TABLET | Freq: Two times a day (BID) | ORAL | 0 refills | Status: DC

## 2022-09-08 MED ORDER — IOHEXOL 350 MG/ML SOLN
100.0000 mL | Freq: Once | INTRAVENOUS | Status: AC | PRN
  Administered 2022-09-08: 100 mL via INTRAVENOUS

## 2022-09-08 MED ORDER — DOXYCYCLINE HYCLATE 100 MG PO CAPS: 100.0000 mg | ORAL_CAPSULE | Freq: Two times a day (BID) | ORAL | 0 refills | Status: AC

## 2022-09-08 NOTE — Discharge Instructions (Addendum)
We evaluated you for your cough and chest pain.  Your imaging studies show pneumonia.  Please stop taking amoxicillin.  I have started you on Augmentin and doxycycline.  Please take these medications as prescribed.  Please follow-up closely with your primary doctor.  If your symptoms worsen or you develop uncontrolled vomiting, difficulty breathing, lightheadedness or dizziness, fainting, coughing up blood, or any other concerning symptoms, please return to the emergency department for repeat evaluation

## 2022-09-08 NOTE — ED Triage Notes (Signed)
Pt states left sided chest pain that started last night radiating to back  States has had cough x 3-4 day States feels like a rock in her chest

## 2022-09-08 NOTE — ED Provider Notes (Signed)
[place] [facility] Provider Note  CSN: [id number] Arrival date & time: 09/08/22 1818  Chief Complaint(s) Chest Pain  HPI Nicole Molina is a 42 y.o. female without significant past medical history presenting to the emergency department with chest pain and cough.  Reports that she has had cough for 3 to 4 days.  Reports that she is also had some congestion, runny nose and sore throat.  She reports that her primary doctor prescribed her amoxicillin for sore throat which has not improved her symptoms.  Chest pain is worse with coughing.  Not pleuritic.  Not exertional.  No lightheadedness or fainting.  No leg swelling or recent travel.  Past Medical History Past Medical History:  Diagnosis Date   Hx of varicella    Kidney stones    Patient Active Problem List   Diagnosis Date Noted   Pregnancy 07/22/2015   Home Medication(s) Prior to Admission medications   Medication Sig Start Date End Date Taking? Authorizing Provider  cyclobenzaprine (FLEXERIL) 10 MG tablet Take 10 mg by mouth.    [provider]  ferrous sulfate (FERROUSUL) 325 (65 FE) MG tablet Take 1 tablet (325 mg total) by mouth daily with breakfast. 07/24/15   Richarda Overlie, MD  levonorgestrel (MIRENA) 20 MCG/24HR IUD 1 each by Intrauterine route once.    [provider]  lidocaine (XYLOCAINE) 2 % solution Use as directed 15 mLs in the mouth or throat as needed (throat pain). 10/29/21   Roemhildt, Lorin T, PA-C  ondansetron (ZOFRAN) 4 MG tablet Take 1 tablet (4 mg total) by mouth every 6 (six) hours. 02/13/21   Curatolo, Adam, DO  ondansetron (ZOFRAN-ODT) 4 MG disintegrating tablet Take 1 tablet (4 mg total) by mouth every 8 (eight) hours as needed for nausea or vomiting. 10/29/21   Roemhildt, Lorin T, PA-C  oxyCODONE-acetaminophen (PERCOCET) 10-325 MG tablet Take 1 tablet by mouth every 6 (six) hours as needed for pain. 02/20/22   Molpus, John, MD  pantoprazole (PROTONIX) 40 MG  tablet Take 40 mg by mouth daily.    [provider]  zolpidem (AMBIEN) 10 MG tablet Take 10 mg by mouth at bedtime as needed for sleep.    [provider]                                                                                                                                    Past Surgical History Past Surgical History:  Procedure Laterality Date   CHOLECYSTECTOMY     Family History History reviewed. No pertinent family history.  Social History Social History   Tobacco Use   Smoking status: Former    Packs/day: .5    Types: Cigarettes   Smokeless tobacco: Never  Substance Use Topics   Alcohol use: No   Drug use: No   Allergies Patient has no known allergies.  Review of Systems Review of Systems  All  other systems reviewed and are negative.   Physical Exam Vital Signs  I have reviewed the triage vital signs BP 101/70 (BP Location: Left Arm)   Pulse (!) 109   Temp 99.5 F (37.5 C) (Oral)   Resp 20   Ht 5\' 2"  (1.575 m)   Wt 77.9 kg   SpO2 97%   BMI 31.41 kg/m  Physical Exam Vitals and nursing note reviewed.  Constitutional:      General: She is not in acute distress.    Appearance: She is well-developed.  HENT:     Head: Normocephalic and atraumatic.     Mouth/Throat:     Mouth: Mucous membranes are moist.     Comments: Mild posterior pharyngeal erythema without exudate Eyes:     Pupils: Pupils are equal, round, and reactive to light.  Cardiovascular:     Rate and Rhythm: Normal rate and regular rhythm.     Heart sounds: No murmur heard. Pulmonary:     Effort: Pulmonary effort is normal. No respiratory distress.     Breath sounds: Normal breath sounds.     Comments: Frequent cough Abdominal:     General: Abdomen is flat.     Palpations: Abdomen is soft.     Tenderness: There is no abdominal tenderness.  Musculoskeletal:        General: No tenderness.     Right lower leg: No edema.     Left lower leg: No edema.  Skin:     General: Skin is warm and dry.  Neurological:     General: No focal deficit present.     Mental Status: She is alert. Mental status is at baseline.  Psychiatric:        Mood and Affect: Mood normal.        Behavior: Behavior normal.     ED Results and Treatments Labs (all labs ordered are listed, but only abnormal results are displayed) Labs Reviewed  BASIC METABOLIC PANEL - Abnormal; Notable for the following components:      Result Value   Calcium 8.6 (*)    All other components within normal limits  RESP PANEL BY RT-PCR (RSV, FLU A&B, COVID)  RVPGX2  CBC  PREGNANCY, URINE  TROPONIN I (HIGH SENSITIVITY)  TROPONIN I (HIGH SENSITIVITY)                                                                                                                          Radiology CT Angio Chest PE W/Cm &/Or Wo Cm  Result Date: 09/08/2022 CLINICAL DATA:  Left chest pain, cough. Pulmonary embolism (PE) suspected, high prob EXAM: CT ANGIOGRAPHY CHEST WITH CONTRAST TECHNIQUE: Multidetector CT imaging of the chest was performed using the standard protocol during bolus administration of intravenous contrast. Multiplanar CT image reconstructions and MIPs were obtained to evaluate the vascular anatomy. RADIATION DOSE REDUCTION: This exam was performed according to the departmental dose-optimization program which includes automated exposure control, adjustment of the mA and/or  kV according to patient size and/or use of iterative reconstruction technique. CONTRAST:  OMNIPAQUE IOHEXOL 350 MG/ML SOLN COMPARISON:  02/13/2021 FINDINGS: Cardiovascular: Adequate opacification of the pulmonary arterial tree. No intraluminal filling defect identified to suggest acute pulmonary embolism. Central pulmonary arteries are of normal caliber. No significant coronary artery calcification. Cardiac size within normal limits. No pericardial effusion. No significant atherosclerotic calcification within the thoracic aorta. No  aortic aneurysm. Mediastinum/Nodes: Shotty left hilar and prevascular lymphadenopathy is likely reactive in nature. No frankly pathologic thoracic adenopathy. Visualized thyroid is unremarkable. Esophagus unremarkable. Lungs/Pleura: Airspace infiltrate noted within the left lower lobe in keeping with changes of acute lobar pneumonia in the appropriate clinical setting. Lungs are otherwise clear. No pneumothorax or pleural effusion. No central obstructing lesion. Upper Abdomen: No acute abnormality. Musculoskeletal: No chest wall abnormality. No acute or significant osseous findings. Review of the MIP images confirms the above findings. IMPRESSION: 1. No pulmonary embolism. 2. Left lower lobe infiltrate in keeping with acute lobar pneumonia in the appropriate clinical setting. Electronically Signed   By: Helyn Numbers M.D.   On: 09/08/2022 20:59   DG Chest 2 View  Result Date: 09/08/2022 CLINICAL DATA:  Chest pain with cough EXAM: CHEST - 2 VIEW COMPARISON:  Chest x-ray 11/28/2021 FINDINGS: The heart size and mediastinal contours are within normal limits. Small focal slightly nodular airspace opacity seen in the left upper lobe measuring 2 cm. There is no pleural effusion or pneumothorax. There surgical clips in the upper abdomen. No fractures are identified. IMPRESSION: 1. Small focal slightly nodular airspace opacity in the left upper lobe measuring 2 cm. Recommend further evaluation with chest CT. 2. No acute cardiopulmonary process. Electronically Signed   By: Darliss Cheney M.D.   On: 09/08/2022 18:43    Pertinent labs & imaging results that were available during my care of the patient were reviewed by me and considered in my medical decision making (see MDM for details).  Medications Ordered in ED Medications  iohexol (OMNIPAQUE) 350 MG/ML injection 100 mL (100 mLs Intravenous Contrast Given 09/08/22 2047)                                                                                                                                      Procedures Procedures  (including critical care time)  Medical Decision Making / ED Course   MDM:  42 year old female presenting to the emergency department with cough.  Patient well-appearing, labs with no leukocytosis, COVID/flu negative.  Symptoms seem most consistent with viral URI, pulmonary exam clear but chest x-ray did have faint opacity.  Obtain CTA chest given patient also had tachycardia which demonstrated a left lung infiltrate concerning for pneumonia.  labs obtained in triage including troponin is negative.  No evidence of pulmonary embolism.  Patient has been on amoxicillin, likely will broaden to Augmentin and doxycycline.  Patient pending repeat troponin although low concern for ACS.  Anticipate discharge, patient not hypoxic, young with reassuring labs.       Additional history obtained: -Additional history obtained from {wsadditionalhistorian:28072} -External records from outside source obtained and reviewed including: Chart review including previous notes, labs, imaging, consultation notes including ***   Lab Tests: -I ordered, reviewed, and interpreted labs.   The pertinent results include:   Labs Reviewed  BASIC METABOLIC PANEL - Abnormal; Notable for the following components:      Result Value   Calcium 8.6 (*)    All other components within normal limits  RESP PANEL BY RT-PCR (RSV, FLU A&B, COVID)  RVPGX2  CBC  PREGNANCY, URINE  TROPONIN I (HIGH SENSITIVITY)  TROPONIN I (HIGH SENSITIVITY)    Notable for ***  EKG   EKG Interpretation  Date/Time:  Friday Sep 08 2022 18:26:39 EDT Ventricular Rate:  99 PR Interval:  133 QRS Duration: 81 QT Interval:  429 QTC Calculation: 551 R Axis:   66 Text Interpretation: Sinus rhythm Borderline T abnormalities, anterior leads Prolonged QT interval No significant change since last tracing Confirmed by Alvino Blood ([id number]) on 09/08/2022 8:48:57 PM         Imaging  Studies ordered: I ordered imaging studies including *** On my interpretation imaging demonstrates *** I independently visualized and interpreted imaging. I agree with the radiologist interpretation   Medicines ordered and prescription drug management: Meds ordered this encounter  Medications   iohexol (OMNIPAQUE) 350 MG/ML injection 100 mL    -I have reviewed the patients home medicines and have made adjustments as needed   Consultations Obtained: I requested consultation with the ***,  and discussed lab and imaging findings as well as pertinent plan - they recommend: ***   Cardiac Monitoring: The patient was maintained on a cardiac monitor.  I personally viewed and interpreted the cardiac monitored which showed an underlying rhythm of: ***  Social Determinants of Health:  Diagnosis or treatment significantly limited by social determinants of health: {wssoc:28071}   Reevaluation: After the interventions noted above, I reevaluated the patient and found that their symptoms have {resolved/improved/worsened:23923::"improved"}  Co morbidities that complicate the patient evaluation  Past Medical History:  Diagnosis Date   Hx of varicella    Kidney stones       Dispostion: Disposition decision including need for hospitalization was considered, and patient {wsdispo:28070::"discharged from emergency department."}    Final Clinical Impression(s) / ED Diagnoses Final diagnoses:  None     This chart was dictated using voice recognition software.  Despite best efforts to proofread,  errors can occur which can change the documentation meaning.

## 2023-01-29 ENCOUNTER — Encounter (HOSPITAL_BASED_OUTPATIENT_CLINIC_OR_DEPARTMENT_OTHER): Payer: Self-pay

## 2023-01-29 ENCOUNTER — Emergency Department (HOSPITAL_BASED_OUTPATIENT_CLINIC_OR_DEPARTMENT_OTHER)
Admission: EM | Admit: 2023-01-29 | Discharge: 2023-01-29 | Disposition: A | Discharge status: Home / Self Care | Payer: Medicaid Other | Attending: Emergency Medicine | Admitting: Emergency Medicine

## 2023-01-29 ENCOUNTER — Emergency Department (HOSPITAL_BASED_OUTPATIENT_CLINIC_OR_DEPARTMENT_OTHER): Payer: Medicaid Other

## 2023-01-29 ENCOUNTER — Other Ambulatory Visit: Payer: Self-pay

## 2023-01-29 DIAGNOSIS — N200 Calculus of kidney: Secondary | ICD-10-CM | POA: Diagnosis not present

## 2023-01-29 DIAGNOSIS — R319 Hematuria, unspecified: Secondary | ICD-10-CM | POA: Diagnosis not present

## 2023-01-29 DIAGNOSIS — R1031 Right lower quadrant pain: Secondary | ICD-10-CM | POA: Diagnosis not present

## 2023-01-29 DIAGNOSIS — N281 Cyst of kidney, acquired: Secondary | ICD-10-CM | POA: Diagnosis not present

## 2023-01-29 DIAGNOSIS — N134 Hydroureter: Secondary | ICD-10-CM | POA: Diagnosis not present

## 2023-01-29 DIAGNOSIS — N132 Hydronephrosis with renal and ureteral calculous obstruction: Secondary | ICD-10-CM | POA: Diagnosis not present

## 2023-01-29 LAB — CBC WITH DIFFERENTIAL/PLATELET
Abs Immature Granulocytes: 0.02 10*3/uL (ref 0.00–0.07)
Basophils Absolute: 0.1 10*3/uL (ref 0.0–0.1)
Basophils Relative: 1 %
Eosinophils Absolute: 0.1 10*3/uL (ref 0.0–0.5)
Eosinophils Relative: 1 %
HCT: 39.2 % (ref 36.0–46.0)
Hemoglobin: 13 g/dL (ref 12.0–15.0)
Immature Granulocytes: 0 %
Lymphocytes Relative: 38 %
Lymphs Abs: 3.5 10*3/uL (ref 0.7–4.0)
MCH: 29.7 pg (ref 26.0–34.0)
MCHC: 33.2 g/dL (ref 30.0–36.0)
MCV: 89.7 fL (ref 80.0–100.0)
Monocytes Absolute: 0.8 10*3/uL (ref 0.1–1.0)
Monocytes Relative: 8 %
Neutro Abs: 4.9 10*3/uL (ref 1.7–7.7)
Neutrophils Relative %: 52 %
Platelets: 262 10*3/uL (ref 150–400)
RBC: 4.37 MIL/uL (ref 3.87–5.11)
RDW: 12.2 % (ref 11.5–15.5)
WBC: 9.3 10*3/uL (ref 4.0–10.5)
nRBC: 0 % (ref 0.0–0.2)

## 2023-01-29 LAB — COMPREHENSIVE METABOLIC PANEL
ALT: 13 U/L (ref 0–44)
AST: 14 U/L — ABNORMAL LOW (ref 15–41)
Albumin: 3.7 g/dL (ref 3.5–5.0)
Alkaline Phosphatase: 49 U/L (ref 38–126)
Anion gap: 11 (ref 5–15)
BUN: 18 mg/dL (ref 6–20)
CO2: 23 mmol/L (ref 22–32)
Calcium: 8.8 mg/dL — ABNORMAL LOW (ref 8.9–10.3)
Chloride: 106 mmol/L (ref 98–111)
Creatinine, Ser: 0.82 mg/dL (ref 0.44–1.00)
GFR, Estimated: >60 mL/min (ref >60)
Glucose, Bld: 106 mg/dL — ABNORMAL HIGH (ref 70–99)
Potassium: 4.1 mmol/L (ref 3.5–5.1)
Sodium: 140 mmol/L (ref 135–145)
Total Bilirubin: 0.6 mg/dL (ref 0.3–1.2)
Total Protein: 7.1 g/dL (ref 6.5–8.1)

## 2023-01-29 LAB — URINALYSIS, MICROSCOPIC (REFLEX): RBC / HPF: >50 RBC/hpf (ref 0–5)

## 2023-01-29 LAB — LIPASE, BLOOD: Lipase: 25 U/L (ref 11–51)

## 2023-01-29 LAB — URINALYSIS, ROUTINE W REFLEX MICROSCOPIC
Bilirubin Urine: NEGATIVE
Glucose, UA: NEGATIVE mg/dL
Ketones, ur: NEGATIVE mg/dL
Leukocytes,Ua: NEGATIVE
Nitrite: NEGATIVE
Protein, ur: 100 mg/dL — AB
Specific Gravity, Urine: >=1.03 (ref 1.005–1.030)
pH: 5.5 (ref 5.0–8.0)

## 2023-01-29 LAB — HCG, QUANTITATIVE, PREGNANCY: hCG, Beta Chain, Quant, S: <1 m[IU]/mL (ref <5)

## 2023-01-29 MED ORDER — KETOROLAC TROMETHAMINE 15 MG/ML IJ SOLN
15.0000 mg | Freq: Once | INTRAMUSCULAR | Status: AC
  Administered 2023-01-29: 15 mg via INTRAVENOUS
  Filled 2023-01-29: qty 1

## 2023-01-29 MED ORDER — ONDANSETRON 4 MG PO TBDP: ORAL_TABLET | ORAL | 0 refills | Status: DC

## 2023-01-29 MED ORDER — ONDANSETRON HCL 4 MG/2ML IJ SOLN
4.0000 mg | Freq: Once | INTRAMUSCULAR | Status: AC
  Administered 2023-01-29: 4 mg via INTRAVENOUS
  Filled 2023-01-29: qty 2

## 2023-01-29 MED ORDER — SODIUM CHLORIDE 0.9 % IV BOLUS
1000.0000 mL | Freq: Once | INTRAVENOUS | Status: AC
  Administered 2023-01-29: 1000 mL via INTRAVENOUS

## 2023-01-29 MED ORDER — TAMSULOSIN HCL 0.4 MG PO CAPS: 0.4000 mg | ORAL_CAPSULE | Freq: Every day | ORAL | 0 refills | Status: AC

## 2023-01-29 MED ORDER — MORPHINE SULFATE (PF) 4 MG/ML IV SOLN
4.0000 mg | Freq: Once | INTRAVENOUS | Status: AC
  Administered 2023-01-29: 4 mg via INTRAVENOUS
  Filled 2023-01-29: qty 1

## 2023-01-29 MED ORDER — MORPHINE SULFATE 15 MG PO TABS: 7.5000 mg | ORAL_TABLET | ORAL | 0 refills | Status: AC | PRN

## 2023-01-29 NOTE — ED Triage Notes (Signed)
Pt presents with complaints of severe sudden onset RLQ abdominal that started an hour ago. N&V+

## 2023-01-29 NOTE — ED Provider Notes (Signed)
[place] [facility] Provider Note   CSN: [id number] Arrival date & time: 01/29/23  0857     History  Chief Complaint  Patient presents with   Abdominal Pain    Nicole Molina is a 42 y.o. female.  42 yo F with a chief complaints of right lower quadrant abdominal pain.  She said that she is felt a little bit unwell for the past week and then suddenly felt a bit worse this morning.  He tried to go to work and felt like her abdomen cramped and she was unable to stand up straight.  She denies any urinary symptoms denies fevers denies constipation or diarrhea.  She has been vomiting this morning.  Denies trauma.  Has a history of kidney stones but feels like this is in a different location and feels different than it normally does.   Abdominal Pain      Home Medications Prior to Admission medications   Medication Sig Start Date End Date Taking? Authorizing Provider  amoxicillin-clavulanate (AUGMENTIN) 875-125 MG tablet Take 1 tablet by mouth every 12 (twelve) hours. 09/08/22   Lonell Grandchild, MD  cyclobenzaprine (FLEXERIL) 10 MG tablet Take 10 mg by mouth.    [provider]  ferrous sulfate (FERROUSUL) 325 (65 FE) MG tablet Take 1 tablet (325 mg total) by mouth daily with breakfast. 07/24/15   Richarda Overlie, MD  levonorgestrel (MIRENA) 20 MCG/24HR IUD 1 each by Intrauterine route once.    [provider]  lidocaine (XYLOCAINE) 2 % solution Use as directed 15 mLs in the mouth or throat as needed (throat pain). 10/29/21   Roemhildt, Lorin T, PA-C  ondansetron (ZOFRAN) 4 MG tablet Take 1 tablet (4 mg total) by mouth every 6 (six) hours. 02/13/21   Curatolo, Adam, DO  ondansetron (ZOFRAN-ODT) 4 MG disintegrating tablet Take 1 tablet (4 mg total) by mouth every 8 (eight) hours as needed for nausea or vomiting. 10/29/21   Roemhildt, Lorin T, PA-C  oxyCODONE-acetaminophen (PERCOCET) 10-325 MG tablet Take 1 tablet by mouth every 6 (six)  hours as needed for pain. 02/20/22   Molpus, John, MD  pantoprazole (PROTONIX) 40 MG tablet Take 40 mg by mouth daily.    [provider]  zolpidem (AMBIEN) 10 MG tablet Take 10 mg by mouth at bedtime as needed for sleep.    [provider]      Allergies    Patient has no known allergies.    Review of Systems   Review of Systems  Gastrointestinal:  Positive for abdominal pain.    Physical Exam Updated Vital Signs BP 132/77 (BP Location: Right Arm)   Pulse 89   Temp 98.1 F (36.7 C) (Oral)   Resp 18   SpO2 100%  Physical Exam Vitals and nursing note reviewed.  Constitutional:      General: She is not in acute distress.    Appearance: She is well-developed. She is not diaphoretic.  HENT:     Head: Normocephalic and atraumatic.  Eyes:     Pupils: Pupils are equal, round, and reactive to light.  Cardiovascular:     Rate and Rhythm: Normal rate and regular rhythm.     Heart sounds: No murmur heard.    No friction rub. No gallop.  Pulmonary:     Effort: Pulmonary effort is normal.     Breath sounds: No wheezing or rales.  Abdominal:     General: There is no distension.  Palpations: Abdomen is soft.     Tenderness: There is abdominal tenderness.     Comments: Pain worse to the right lower quadrant.  Some suprapubic tenderness.  No obvious pain in the right upper quadrant.  Musculoskeletal:        General: No tenderness.     Cervical back: Normal range of motion and neck supple.  Skin:    General: Skin is warm and dry.  Neurological:     Mental Status: She is alert and oriented to person, place, and time.  Psychiatric:        Behavior: Behavior normal.     ED Results / Procedures / Treatments   Labs (all labs ordered are listed, but only abnormal results are displayed) Labs Reviewed  CBC WITH DIFFERENTIAL/PLATELET  COMPREHENSIVE METABOLIC PANEL  LIPASE, BLOOD  HCG, QUANTITATIVE, PREGNANCY  URINALYSIS, ROUTINE W REFLEX MICROSCOPIC     EKG None  Radiology No results found.  Procedures Procedures    Medications Ordered in ED Medications  sodium chloride 0.9 % bolus 1,000 mL (has no administration in time range)  morphine (PF) 4 MG/ML injection 4 mg (4 mg Intravenous Given 01/29/23 0916)  ondansetron (ZOFRAN) injection 4 mg (4 mg Intravenous Given 01/29/23 5409)    ED Course/ Medical Decision Making/ A&P                                 Medical Decision Making Amount and/or Complexity of Data Reviewed Labs: ordered. Radiology: ordered.  Risk Prescription drug management.   42 yo F with a chief complaints of right lower quadrant abdominal pain.  This has been going on just and she got to work this morning.  She tells me that her abdomen did not feel right for the past week.  Sudden severe pain with nausea and vomiting.  Patient has a history of kidney stones though feels like this is different.  Will obtain CT imaging.  Treat pain and nausea.  Reassess.  UA without infection, positive for hematuria.  No anemia, LFTs and lipase are unremarkable.  CT scan is concerning for hydronephrosis on the right on my independent trepidation.  Radiology read with 5 mm stone urology follow-up.  Patient feeling much better on repeat assessment.  12:03 PM:  I have discussed the diagnosis/risks/treatment options with the patient.  Evaluation and diagnostic testing in the emergency department does not suggest an emergent condition requiring admission or immediate intervention beyond what has been performed at this time.  They will follow up with Urology. We also discussed returning to the ED immediately if new or worsening sx occur. We discussed the sx which are most concerning (e.g., sudden worsening pain, fever, inability to tolerate by mouth) that necessitate immediate return. Medications administered to the patient during their visit and any new prescriptions provided to the patient are listed below.  Medications given  during this visit Medications  ketorolac (TORADOL) 15 MG/ML injection 15 mg (has no administration in time range)  sodium chloride 0.9 % bolus 1,000 mL (1,000 mLs Intravenous New Bag/Given 01/29/23 0928)  morphine (PF) 4 MG/ML injection 4 mg (4 mg Intravenous Given 01/29/23 0916)  ondansetron (ZOFRAN) injection 4 mg (4 mg Intravenous Given 01/29/23 0916)     The patient appears reasonably screen and/or stabilized for discharge and I doubt any other medical condition or other [facility] requiring further screening, evaluation, or treatment in the ED at this time prior to discharge.  Final Clinical Impression(s) / ED Diagnoses Final diagnoses:  None    Rx / DC Orders ED Discharge Orders     None         Melene Plan, DO 01/29/23 1204

## 2023-01-29 NOTE — Discharge Instructions (Signed)
Take 4 over the counter ibuprofen tablets 3 times a day or 2 over-the-counter naproxen tablets twice a day for pain. Also take tylenol 1000mg (2 extra strength) four times a day.   Then take the pain medicine if you feel like you need it. Narcotics do not help with the pain, they only make you care about it less.  You can become addicted to this, people may break into your house to steal it.  It will constipate you.  If you drive under the influence of this medicine you can get a DUI.

## 2023-02-01 ENCOUNTER — Other Ambulatory Visit: Payer: Self-pay | Admitting: Urology

## 2023-02-01 ENCOUNTER — Encounter (HOSPITAL_BASED_OUTPATIENT_CLINIC_OR_DEPARTMENT_OTHER): Payer: Self-pay | Admitting: Urology

## 2023-02-01 DIAGNOSIS — N281 Cyst of kidney, acquired: Secondary | ICD-10-CM | POA: Diagnosis not present

## 2023-02-01 DIAGNOSIS — N201 Calculus of ureter: Secondary | ICD-10-CM | POA: Diagnosis not present

## 2023-02-01 NOTE — Progress Notes (Signed)
Pre-op phone call complete. Procedure date and arrival time confirmed (02/01/23 at 0645). Patient allergies, medical history, and medications verified. Patient advised not to have Aspirin, Motrin, Pepto Bismol, or Morphine. Patient denies any recent history of chest pain or COVID. Driver secured.

## 2023-02-02 ENCOUNTER — Encounter (HOSPITAL_BASED_OUTPATIENT_CLINIC_OR_DEPARTMENT_OTHER)
Admission: RE | Disposition: A | Discharge status: Home / Self Care | Payer: Self-pay | Source: Home / Self Care | Attending: Urology

## 2023-02-02 ENCOUNTER — Ambulatory Visit (HOSPITAL_COMMUNITY): Payer: Medicaid Other

## 2023-02-02 ENCOUNTER — Ambulatory Visit (HOSPITAL_BASED_OUTPATIENT_CLINIC_OR_DEPARTMENT_OTHER)
Admission: RE | Admit: 2023-02-02 | Discharge: 2023-02-02 | Disposition: A | Discharge status: Home / Self Care | Payer: Medicaid Other | Attending: Urology | Admitting: Urology

## 2023-02-02 ENCOUNTER — Other Ambulatory Visit: Payer: Self-pay

## 2023-02-02 ENCOUNTER — Encounter (HOSPITAL_BASED_OUTPATIENT_CLINIC_OR_DEPARTMENT_OTHER): Payer: Self-pay | Admitting: Urology

## 2023-02-02 DIAGNOSIS — N132 Hydronephrosis with renal and ureteral calculous obstruction: Secondary | ICD-10-CM | POA: Insufficient documentation

## 2023-02-02 DIAGNOSIS — E669 Obesity, unspecified: Secondary | ICD-10-CM | POA: Diagnosis not present

## 2023-02-02 DIAGNOSIS — Z01818 Encounter for other preprocedural examination: Secondary | ICD-10-CM

## 2023-02-02 DIAGNOSIS — Z6832 Body mass index (BMI) 32.0-32.9, adult: Secondary | ICD-10-CM | POA: Diagnosis not present

## 2023-02-02 DIAGNOSIS — N281 Cyst of kidney, acquired: Secondary | ICD-10-CM | POA: Insufficient documentation

## 2023-02-02 DIAGNOSIS — N201 Calculus of ureter: Secondary | ICD-10-CM | POA: Diagnosis not present

## 2023-02-02 DIAGNOSIS — F17290 Nicotine dependence, other tobacco product, uncomplicated: Secondary | ICD-10-CM | POA: Diagnosis not present

## 2023-02-02 DIAGNOSIS — R310 Gross hematuria: Secondary | ICD-10-CM | POA: Diagnosis not present

## 2023-02-02 HISTORY — PX: EXTRACORPOREAL SHOCK WAVE LITHOTRIPSY: SHX1557

## 2023-02-02 LAB — POCT PREGNANCY, URINE: Preg Test, Ur: NEGATIVE

## 2023-02-02 SURGERY — LITHOTRIPSY, ESWL
Anesthesia: LOCAL | Laterality: Right

## 2023-02-02 MED ORDER — CIPROFLOXACIN HCL 500 MG PO TABS
ORAL_TABLET | ORAL | Status: AC
  Filled 2023-02-02: qty 1

## 2023-02-02 MED ORDER — SODIUM CHLORIDE 0.9 % IV SOLN: INTRAVENOUS | Status: DC

## 2023-02-02 MED ORDER — CIPROFLOXACIN HCL 500 MG PO TABS
500.0000 mg | ORAL_TABLET | ORAL | Status: AC
  Administered 2023-02-02: 500 mg via ORAL

## 2023-02-02 MED ORDER — ONDANSETRON 4 MG PO TBDP
ORAL_TABLET | ORAL | Status: AC
  Filled 2023-02-02: qty 1

## 2023-02-02 MED ORDER — ONDANSETRON 4 MG PO TBDP
4.0000 mg | ORAL_TABLET | Freq: Once | ORAL | Status: AC
  Administered 2023-02-02: 4 mg via ORAL

## 2023-02-02 MED ORDER — DIPHENHYDRAMINE HCL 25 MG PO CAPS
25.0000 mg | ORAL_CAPSULE | ORAL | Status: AC
  Administered 2023-02-02: 25 mg via ORAL

## 2023-02-02 MED ORDER — DIPHENHYDRAMINE HCL 25 MG PO CAPS
ORAL_CAPSULE | ORAL | Status: AC
  Filled 2023-02-02: qty 1

## 2023-02-02 MED ORDER — DIAZEPAM 5 MG PO TABS
10.0000 mg | ORAL_TABLET | ORAL | Status: AC
  Administered 2023-02-02: 10 mg via ORAL

## 2023-02-02 MED ORDER — DIAZEPAM 5 MG PO TABS
ORAL_TABLET | ORAL | Status: AC
  Filled 2023-02-02: qty 2

## 2023-02-02 NOTE — H&P (Signed)
CC/HPI: Pain9/26/2024: New patient seen today for emergency department follow-up with an obstructing distal right ureteral calculi. Presented to the ED on 9/23 with worsening right lower quadrant abdominal pain. Creatinine stable at 0.83, no leukocytosis, UA with microscopic hematuria but no overt concerns for infection. CT imaging revealed an obstructing 5 mm distal right ureteral calculi. Also with a stable appearing 5 cm simple appearing left renal cyst. No other acute or significant kidney bladder abnormality noted including absence of other stones. We saw her back in 2016 with a ureteral calculi but has not followed up since then. No prior urological surgery. She was given prescriptions for morphine, Zofran and Flomax but symptoms have been poorly controlled.   Continuing to have in the right lower quadrant of the abdomen radiating into the flank. Associated with increased urinary urgency, hesitancy and intermittency of stream. She is also been having some intermittent gross hematuria. She has had nausea with dry heaves, denies fevers or chills. She tells me for the most part she has been able to keep things down in regards to fluid and solid food intake.     ALLERGIES: No Allergies    MEDICATIONS: Omeprazole 20 mg tablet, delayed release  Morphine Sulfate 15 mg tablet  Zofran     GU PSH: None     PSH Notes: Cholecystectomy   NON-GU PSH: Cholecystectomy (laparoscopic) - about 2010 Cholecystectomy (open) - 2016     GU PMH: Other microscopic hematuria, Microscopic hematuria - 2016 Ureteral calculus, Calculus of distal right ureter - 2016 Urinary Tract Inf, Unspec site, Pyuria - 2016    NON-GU PMH: Encounter for general adult medical examination without abnormal findings, Encounter for preventive health examination - 2016 Personal history of other specified conditions, History of heartburn - 2016 GERD    FAMILY HISTORY: Kidney Stones - Mother, Brother    Notes: 1 son 3 daughters    SOCIAL HISTORY: Marital Status: Married Preferred Language: English Has never drank.  Drinks 3 caffeinated drinks per day. 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     Notes: Alcohol use, Student, Father's age, Current smoker, Mother's age, Three children, Married, Caffeine use smokes hookah   REVIEW OF SYSTEMS:    GU Review Female:   Patient reports hard to postpone urination, get up at night to urinate, stream starts and stops, and trouble starting your stream. Patient denies frequent urination, burning /pain with urination, leakage of urine, have to strain to urinate, and being pregnant.  Gastrointestinal (Upper):   Patient reports nausea, vomiting, and indigestion/ heartburn.   Gastrointestinal (Lower):   Patient denies diarrhea and constipation.  Constitutional:   Patient reports night sweats and fatigue. Patient denies fever and weight loss.  Skin:   Patient denies skin rash/ lesion and itching.  Eyes:   Patient denies blurred vision and double vision.  Ears/ Nose/ Throat:   Patient denies sore throat and sinus problems.  Hematologic/Lymphatic:   Patient denies swollen glands and easy bruising.  Cardiovascular:   Patient denies leg swelling and chest pains.  Respiratory:   Patient denies cough and shortness of breath.  Endocrine:   Patient denies excessive thirst.  Musculoskeletal:   Patient reports back pain. Patient denies joint pain.  Neurological:   Patient denies dizziness and headaches.  Psychologic:   Patient denies depression and anxiety.   VITAL SIGNS:      02/01/2023 10:03 AM  Weight 174 lb / 78.93 kg  Height 60 in / 152.4 cm  BP 97/63 mmHg  Pulse 97 /min  Temperature 97.5 F / 36.3 C  BMI 34.0 kg/m   MULTI-SYSTEM PHYSICAL EXAMINATION:    Constitutional: Well-nourished. No physical deformities. Normally developed. Good grooming.  Neck: Neck symmetrical, not swollen. Normal tracheal position.  Respiratory: No labored breathing, no use of accessory muscles.    Cardiovascular: Normal temperature, normal extremity pulses, no swelling, no varicosities.  Skin: No paleness, no jaundice, no cyanosis. No lesion, no ulcer, no rash.  Neurologic / Psychiatric: Oriented to time, oriented to place, oriented to person. No depression, no anxiety, no agitation.  Gastrointestinal: No mass, no tenderness, no rigidity, non obese abdomen. Mild CVA and flank discomfort on the right  Musculoskeletal: Normal gait and station of head and neck.     Complexity of Data:  Source Of History:  Patient, Medical Record Summary  Lab Test Review:   CBC with Diff, CMP  Records Review:   Previous Hospital Records  Urine Test Review:   Urinalysis  X-Ray Review: C.T. Abdomen/Pelvis: Reviewed Films. Reviewed Report.     02/01/23  Urinalysis  Urine Appearance Cloudy   Urine Color Yellow   Urine Glucose Neg mg/dL  Urine Bilirubin Neg mg/dL  Urine Ketones Neg mg/dL  Urine Specific Gravity 1.025   Urine Blood 1+ ery/uL  Urine pH 6.0   Urine Protein Neg mg/dL  Urine Urobilinogen 1.0 mg/dL  Urine Nitrites Neg   Urine Leukocyte Esterase Neg leu/uL  Urine WBC/hpf NS (Not Seen)   Urine RBC/hpf 0 - 2/hpf   Urine Epithelial Cells 0 - 5/hpf   Urine Bacteria NS (Not Seen)   Urine Mucous Not Present   Urine Yeast NS (Not Seen)   Urine Trichomonas Not Present   Urine Cystals NS (Not Seen)   Urine Casts NS (Not Seen)   Urine Sperm Not Present   Notes:                     CLINICAL DATA: Right lower quadrant pain, nausea, vomiting,  hematuria   EXAM:  CT ABDOMEN AND PELVIS WITHOUT CONTRAST   TECHNIQUE:  Multidetector CT imaging of the abdomen and pelvis was performed  following the standard protocol without IV contrast.   RADIATION DOSE REDUCTION: This exam was performed according to the  departmental dose-optimization program which includes automated  exposure control, adjustment of the mA and/or kV according to  patient size and/or use of iterative reconstruction  technique.   COMPARISON: 02/13/2021   FINDINGS:  Lower chest: No acute abnormality.   Hepatobiliary: No focal liver abnormality is seen. Status post  cholecystectomy. No biliary dilatation.   Pancreas: Unremarkable. No pancreatic ductal dilatation or  surrounding inflammatory changes.   Spleen: Normal in size without focal abnormality.   Adrenals/Urinary Tract: Adrenal glands are normal. Mild right  hydronephrosis and hydroureter due to 5 mm obstructing calculus in  the right distal ureter. 5.1 cm simple cyst in the upper pole of the  left kidney does not require dedicated imaging follow-up. Kidneys,  ureters, and bladder otherwise normal.   Stomach/Bowel: Stomach is within normal limits. Appendix appears  normal. No evidence of bowel wall thickening, distention, or  inflammatory changes.   Vascular/Lymphatic: No significant vascular findings are present. No  enlarged abdominal or pelvic lymph nodes.   Reproductive: Uterus is retroverted. IUD is present. No adnexal  abnormality.   Other: No abdominal wall hernia or abnormality. No abdominopelvic  ascites.   Musculoskeletal: No acute or significant osseous findings.   IMPRESSION:  Mild right hydronephrosis and hydroureter due  to 5 mm obstructing  calculus in the right distal ureter.    Electronically Signed  By: Acquanetta Belling M.D.  On: 01/29/2023 11:53     PROCEDURES:         KUB - 74018  A single view of the abdomen is obtained. Previously seen distal right ureteral calculi on prior CT imaging is easily identified on today's exam. There is just medial to a pelvic phlebolith. I drew arrows for better clarification/identification. There is an IUD present. No other concerning GU abnormality noted on today's study including absence of any additional renal or ureteral calculi.      . Patient confirmed No Neulasta OnPro Device.           Urinalysis w/Scope - 81001 Dipstick Dipstick Cont'd Micro  Color: Yellow  Bilirubin: Neg mg/dL WBC/hpf: NS (Not Seen)  Appearance: Cloudy Ketones: Neg mg/dL RBC/hpf: 0 - 2/hpf  Specific Gravity: 1.025 Blood: 1+ ery/uL Bacteria: NS (Not Seen)  pH: 6.0 Protein: Neg mg/dL Cystals: NS (Not Seen)  Glucose: Neg mg/dL Urobilinogen: 1.0 mg/dL Casts: NS (Not Seen)    Nitrites: Neg Trichomonas: Not Present    Leukocyte Esterase: Neg leu/uL Mucous: Not Present      Epithelial Cells: 0 - 5/hpf      Yeast: NS (Not Seen)      Sperm: Not Present    Notes: Micro performed on unspun urine    ASSESSMENT:      ICD-10 Details  1 GU:   Ureteral calculus - N20.1 Right, Undiagnosed New Problem  2   Flank Pain - R10.84 Undiagnosed New Problem  3   Renal cyst - N28.1 Left, Undiagnosed New Problem   PLAN:            Medications New Meds: Tamsulosin Hcl 0.4 mg capsule 1 capsule PO Daily   #30  0 Refill(s)  Oxycodone-Acetaminophen 5 mg-325 mg tablet 1 tablet PO Q 6 H PRN   #15  0 Refill(s)  Pharmacy Name:  [facility] [phone]  Address:  [address]   [place], [place] [address]  Phone:  [phone]  Fax:  [phone]            Orders Labs Urine Culture  X-Rays: KUB          Schedule         Document Letter(s):  Created for Patient: Clinical Summary         Notes:   We discussed management strategies of ureteral stones including medical expulsive therapy (MET) (preferred for stones <74mm diameter), ureteroscopic stone manipulation (URS), and shockwave lithotripsy (SWL) in detail including relative risks / benefits / and efficacy. We discussed that all patients are candidates for MET as long as can keep comfortable and hydrated.   Patient's distal right ureteral calculi easily seen on today's KUB study. She remains symptomatic with minimally controlled symptoms. I discussed at length with her regarding proceeding with a definitive intervention instead of giving additional time for medical expulsive therapy to work. I think she would be a great candidate for  shockwave lithotripsy.   For shockwave lithotripsy I described the risks which include arrhythmia, kidney contusion, kidney hemorrhage, need for transfusion, long-term risk of diabetes or hypertension, back discomfort, flank ecchymosis, flank abrasion, inability to break up stone, inability to pass stone fragments, Steinstrasse, infection associated with obstructing stones, need for different surgical procedure and possible need for repeat shockwave lithotripsy.   All questions answered to the best of my ability with understanding  expressed by the patient regarding the pending procedure expected postoperative course. For now she will continue tamsulosin and Zofran as needed. I sent in a refill for pain medication for her. With acutely worsening and poorly controlled symptomology over the next 24 hours she understands she will need to present to the emergency department for additional management. I am going to work on getting her added on for shockwave lithotripsy tomorrow with Dr. Arita Miss

## 2023-02-02 NOTE — Discharge Instructions (Signed)
See [facility] discharge instructions in chart.  

## 2023-02-02 NOTE — Op Note (Signed)
See [facility] OP note scanned into chart. Also because of the size, density, location and other factors that cannot be anticipated I feel this will likely be a staged procedure. This fact supersedes any indication in the scanned [facility] operative note to the contrary.  

## 2023-02-05 ENCOUNTER — Encounter (HOSPITAL_BASED_OUTPATIENT_CLINIC_OR_DEPARTMENT_OTHER): Payer: Self-pay | Admitting: Urology

## 2023-02-15 DIAGNOSIS — N201 Calculus of ureter: Secondary | ICD-10-CM | POA: Diagnosis not present

## 2023-06-07 ENCOUNTER — Other Ambulatory Visit: Payer: Self-pay | Admitting: Obstetrics and Gynecology

## 2023-06-07 DIAGNOSIS — R928 Other abnormal and inconclusive findings on diagnostic imaging of breast: Secondary | ICD-10-CM

## 2023-06-15 ENCOUNTER — Ambulatory Visit
Admission: RE | Admit: 2023-06-15 | Discharge: 2023-06-15 | Disposition: A | Discharge status: Home / Self Care | Payer: Medicaid Other | Source: Ambulatory Visit | Attending: Obstetrics and Gynecology | Admitting: Obstetrics and Gynecology

## 2023-06-15 ENCOUNTER — Ambulatory Visit
Admission: RE | Admit: 2023-06-15 | Discharge: 2023-06-15 | Disposition: A | Discharge status: Home / Self Care | Payer: Commercial Managed Care - PPO | Source: Ambulatory Visit | Attending: Obstetrics and Gynecology | Admitting: Obstetrics and Gynecology

## 2023-06-15 ENCOUNTER — Other Ambulatory Visit: Payer: Self-pay | Admitting: Obstetrics and Gynecology

## 2023-06-15 DIAGNOSIS — R928 Other abnormal and inconclusive findings on diagnostic imaging of breast: Secondary | ICD-10-CM

## 2023-06-15 DIAGNOSIS — N6320 Unspecified lump in the left breast, unspecified quadrant: Secondary | ICD-10-CM

## 2023-06-15 DIAGNOSIS — N6322 Unspecified lump in the left breast, upper inner quadrant: Secondary | ICD-10-CM | POA: Diagnosis not present

## 2023-06-19 ENCOUNTER — Ambulatory Visit
Admission: RE | Admit: 2023-06-19 | Discharge: 2023-06-19 | Discharge status: Home / Self Care | Payer: Commercial Managed Care - PPO | Source: Ambulatory Visit | Attending: Obstetrics and Gynecology | Admitting: Obstetrics and Gynecology

## 2023-06-19 ENCOUNTER — Ambulatory Visit
Admission: RE | Admit: 2023-06-19 | Discharge: 2023-06-19 | Disposition: A | Discharge status: Home / Self Care | Payer: Commercial Managed Care - PPO | Source: Ambulatory Visit | Attending: Obstetrics and Gynecology | Admitting: Obstetrics and Gynecology

## 2023-06-19 DIAGNOSIS — N6320 Unspecified lump in the left breast, unspecified quadrant: Secondary | ICD-10-CM

## 2023-06-19 DIAGNOSIS — R928 Other abnormal and inconclusive findings on diagnostic imaging of breast: Secondary | ICD-10-CM

## 2023-06-19 DIAGNOSIS — N6322 Unspecified lump in the left breast, upper inner quadrant: Secondary | ICD-10-CM | POA: Diagnosis not present

## 2023-06-19 HISTORY — PX: BREAST BIOPSY: SHX20

## 2023-06-20 LAB — SURGICAL PATHOLOGY

## 2023-06-22 ENCOUNTER — Other Ambulatory Visit: Payer: Medicaid Other

## 2023-07-06 IMAGING — CT CT ANGIO CHEST-ABD-PELV FOR DISSECTION W/ AND WO/W CM
2 of 9 series · 14 of 46 positions shown, 16 images · IV contrast (Omnipaque)
Comparison: CT chest 05/12/2019.  CT abdomen and pelvis 08/06/2014

CLINICAL DATA: Pain. Mid chest pain radiating to the right side
since this afternoon. Vomiting. Dizziness.

EXAM:
CT ANGIOGRAPHY CHEST, ABDOMEN AND PELVIS
TECHNIQUE: Non-contrast CT of the chest was initially obtained.

[Series 4: axial arterial · axial · arterial · 0.70mm/px · z∈[+650,+1196]mm · 11 of 204 slices shown, 13 images]
[im 11/204  soft-tissue]
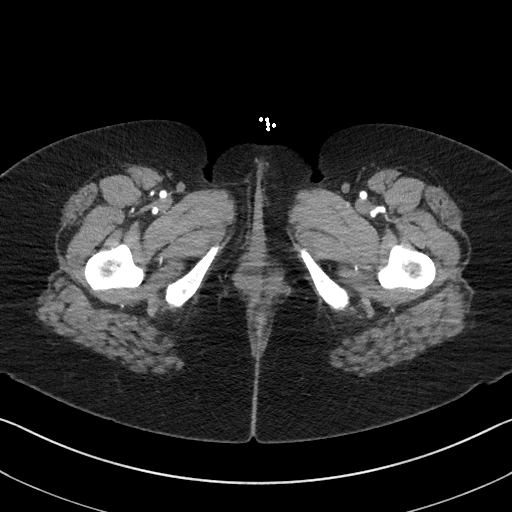
[im 11/204  bone]
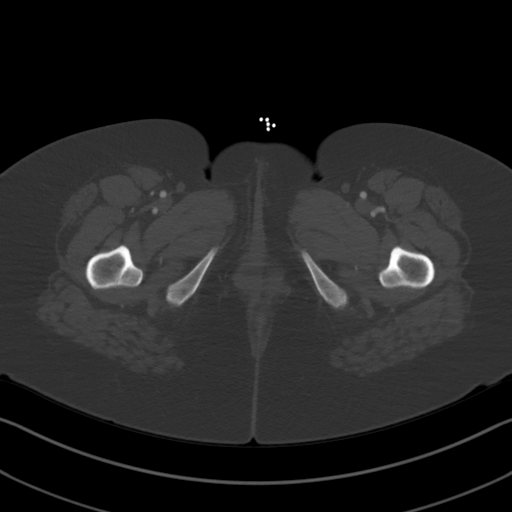
[im 33/204  soft-tissue]
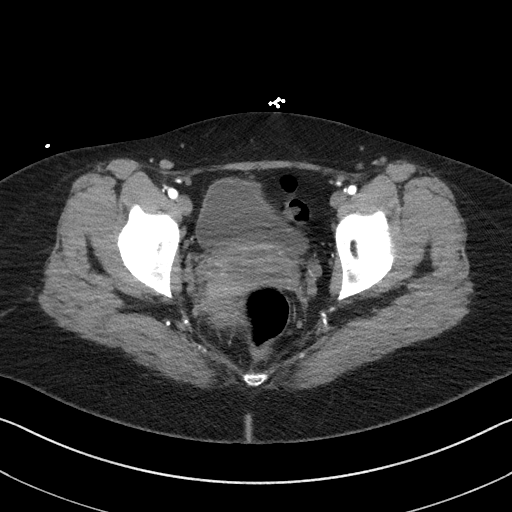
[im 54/204  soft-tissue]
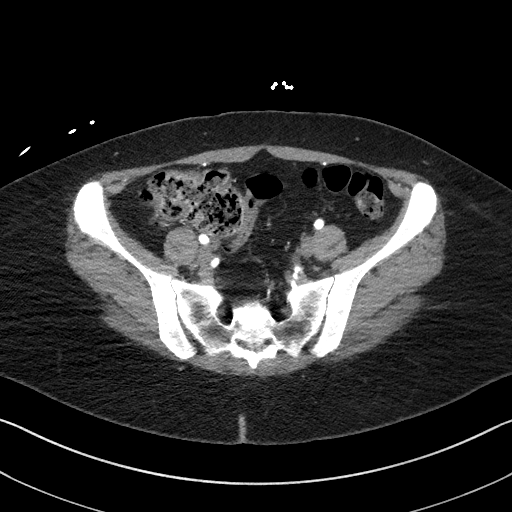
[im 65/204  soft-tissue]
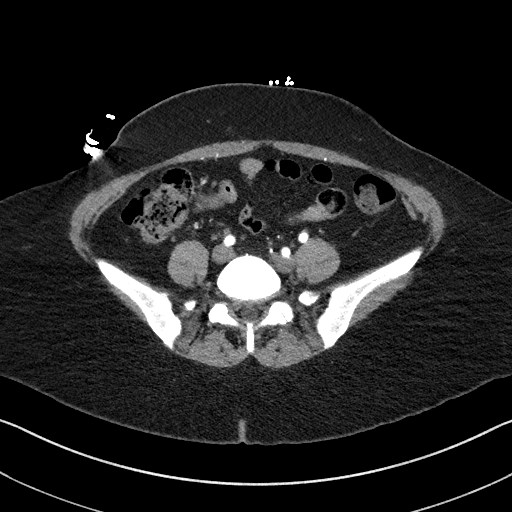
[im 86/204  soft-tissue]
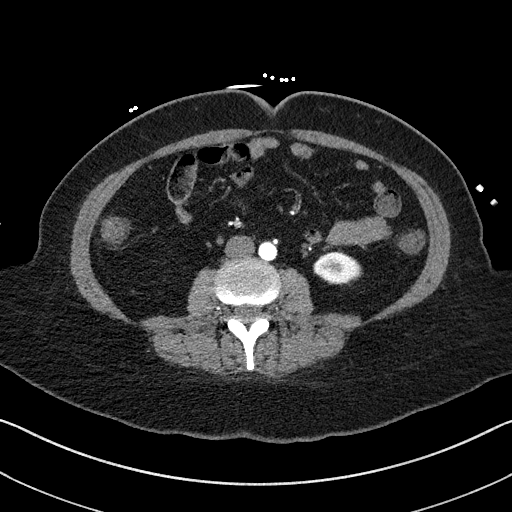
[im 107/204  soft-tissue]
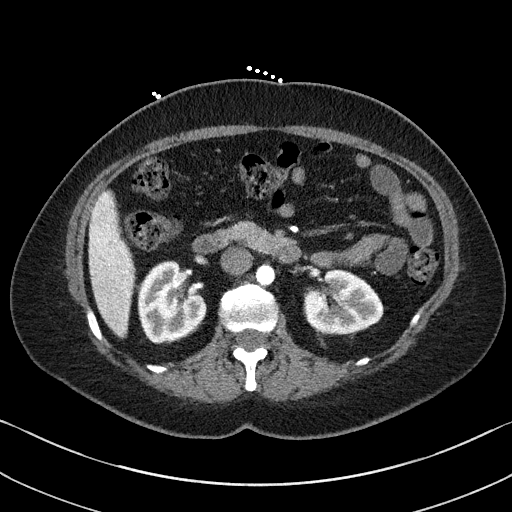
[im 118/204  soft-tissue]
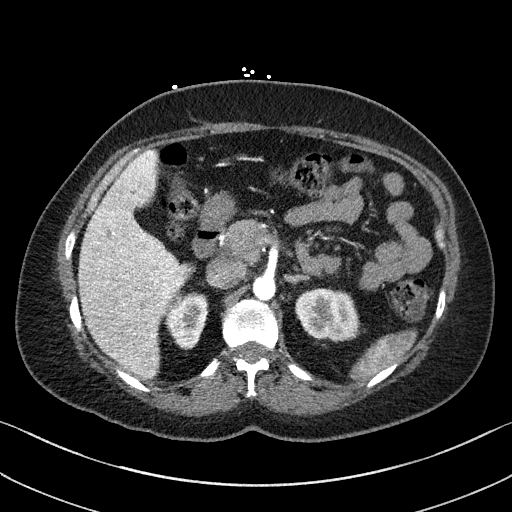
[im 139/204  soft-tissue]
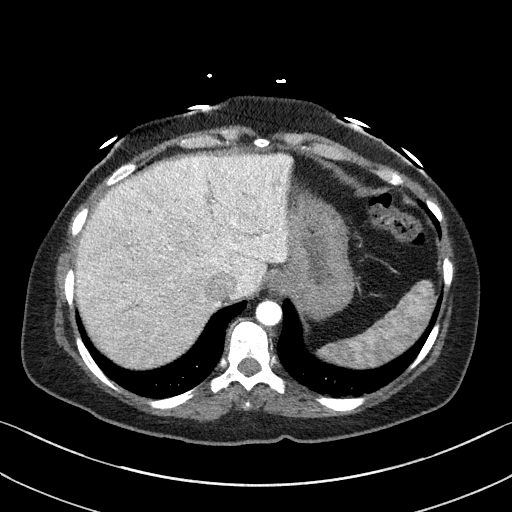
[im 150/204  soft-tissue]
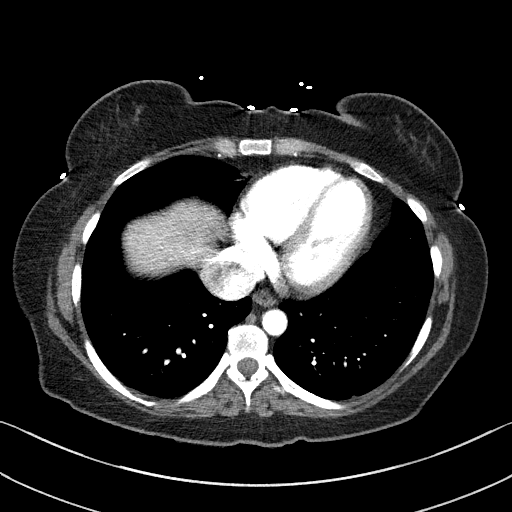
[im 150/204  bone]
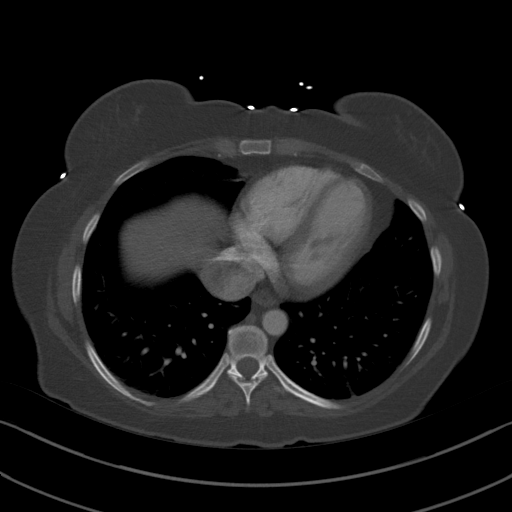
[im 171/204  soft-tissue]
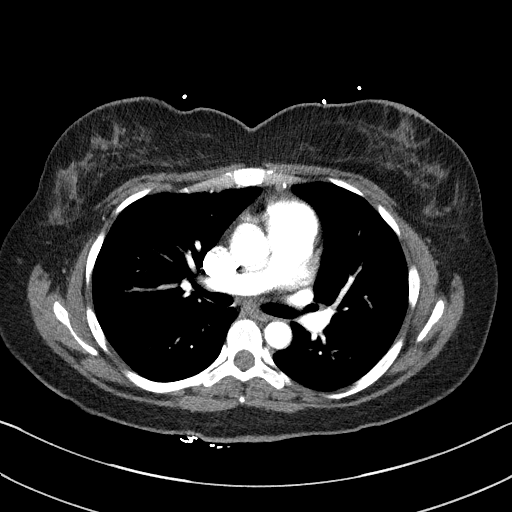
[im 193/204  soft-tissue]
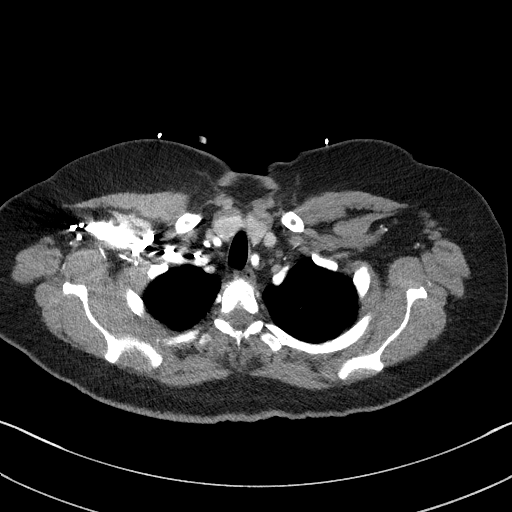

[Series 9: coronals · coronal · 0.86mm/px · 3 of 142 slices shown]
[im 36/142  soft-tissue]
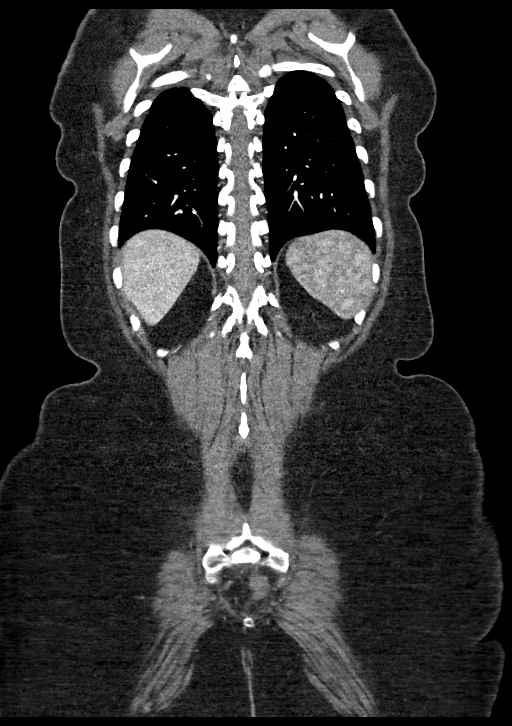
[im 71/142  soft-tissue]
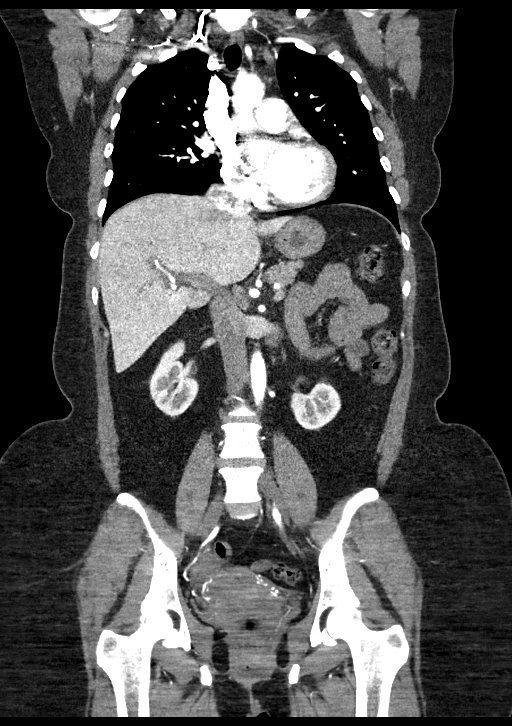
[im 106/142  soft-tissue]
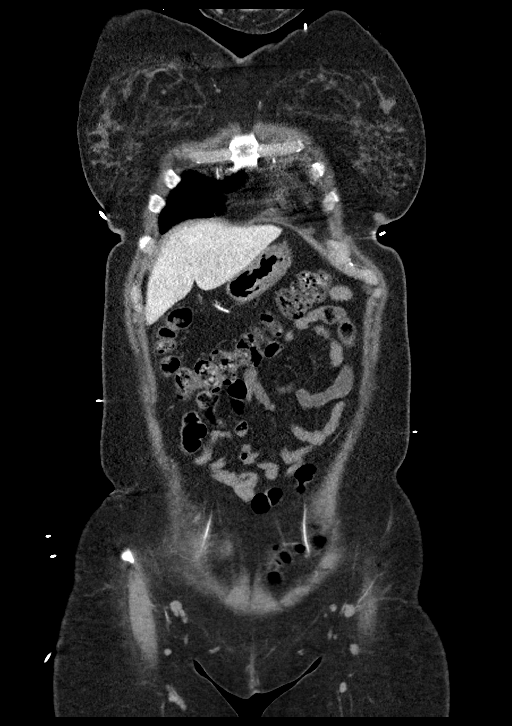

[14 of 46 positions shown; findings below may reference images not displayed]

Multidetector CT imaging through the chest, abdomen and pelvis was
performed using the standard protocol during bolus administration of
intravenous contrast. Multiplanar reconstructed images and MIPs were
obtained and reviewed to evaluate the vascular anatomy.

CONTRAST:  100mL OMNIPAQUE IOHEXOL 350 MG/ML SOLN
FINDINGS: CTA CHEST FINDINGS

Cardiovascular: Unenhanced images of the chest demonstrate no
significant vascular calcifications. No evidence of intramural
hematoma in the thoracic aorta. Surgical clips in the right upper
quadrant.

Images obtained during the arterial phase after intravenous contrast
administration demonstrate normal caliber thoracic aorta. No
aneurysm or dissection. Great vessel origins are patent. Central
pulmonary arteries are moderately well opacified without evidence of
significant central pulmonary embolus. Heart size is normal. No
pericardial effusions.

Mediastinum/Nodes: No enlarged mediastinal, hilar, or axillary lymph
nodes. Thyroid gland, trachea, and esophagus demonstrate no
significant findings.

Lungs/Pleura: Mild dependent changes in the lung bases. No airspace
disease or consolidation. No pleural effusions. No pneumothorax.
Airways are patent.

Musculoskeletal: No chest wall abnormality. No acute or significant
osseous findings.

Review of the MIP images confirms the above findings.

CTA ABDOMEN AND PELVIS FINDINGS

VASCULAR

Aorta: Normal caliber aorta without aneurysm, dissection, vasculitis
or significant stenosis.

Celiac: Patent without evidence of aneurysm, dissection, vasculitis
or significant stenosis.

SMA: Patent without evidence of aneurysm, dissection, vasculitis or
significant stenosis.

Renals: Both renal arteries are patent without evidence of aneurysm,
dissection, vasculitis, fibromuscular dysplasia or significant
stenosis.

IMA: Patent without evidence of aneurysm, dissection, vasculitis or
significant stenosis.

Inflow: Patent without evidence of aneurysm, dissection, vasculitis
or significant stenosis.

Veins: No obvious venous abnormality within the limitations of this
arterial phase study.

Review of the MIP images confirms the above findings.

NON-VASCULAR

Hepatobiliary: Surgical absence of the gallbladder. Mild bile duct
dilatation is likely normal for postoperative physiology. No focal
liver lesions.

Pancreas: Unremarkable. No pancreatic ductal dilatation or
surrounding inflammatory changes.

Spleen: Normal in size without focal abnormality.

Adrenals/Urinary Tract: No adrenal gland nodules. Cyst in the left
kidney unchanged since prior study. Stone in the midportion of the
right kidney measuring 3 mm diameter. No hydronephrosis or
hydroureter. Bladder is unremarkable.

Stomach/Bowel: Stomach, small bowel, and colon are not abnormally
distended. No wall thickening or inflammatory changes are
appreciated. Appendix is not identified.

Lymphatic: No significant lymphadenopathy. Scattered lymph nodes are
not pathologically enlarged, likely reactive.

Reproductive: Uterus is retroverted without enlargement.
Intrauterine device is present. Ovaries are not enlarged.

Other: No free air or free fluid in the abdomen. Abdominal wall
musculature appears intact.

Musculoskeletal: No acute or significant osseous findings.

Review of the MIP images confirms the above findings.
IMPRESSION: 1. No evidence of aneurysm or dissection involving the thoracic or
abdominal aorta.
2. No evidence of active pulmonary disease.
3. No acute process demonstrated in the abdomen or pelvis. No
evidence of bowel obstruction or inflammation.

## 2023-07-24 DIAGNOSIS — R5383 Other fatigue: Secondary | ICD-10-CM | POA: Diagnosis not present

## 2023-08-06 ENCOUNTER — Encounter (HOSPITAL_BASED_OUTPATIENT_CLINIC_OR_DEPARTMENT_OTHER): Payer: Self-pay | Admitting: Emergency Medicine

## 2023-08-06 ENCOUNTER — Other Ambulatory Visit: Payer: Self-pay

## 2023-08-06 ENCOUNTER — Emergency Department (HOSPITAL_BASED_OUTPATIENT_CLINIC_OR_DEPARTMENT_OTHER)

## 2023-08-06 DIAGNOSIS — R059 Cough, unspecified: Secondary | ICD-10-CM | POA: Diagnosis present

## 2023-08-06 DIAGNOSIS — J069 Acute upper respiratory infection, unspecified: Secondary | ICD-10-CM | POA: Insufficient documentation

## 2023-08-06 DIAGNOSIS — R0789 Other chest pain: Secondary | ICD-10-CM | POA: Insufficient documentation

## 2023-08-06 LAB — CBC WITH DIFFERENTIAL/PLATELET
Abs Immature Granulocytes: 0.03 10*3/uL (ref 0.00–0.07)
Basophils Absolute: 0.1 10*3/uL (ref 0.0–0.1)
Basophils Relative: 1 %
Eosinophils Absolute: 0.2 10*3/uL (ref 0.0–0.5)
Eosinophils Relative: 3 %
HCT: 37 % (ref 36.0–46.0)
Hemoglobin: 12.6 g/dL (ref 12.0–15.0)
Immature Granulocytes: 0 %
Lymphocytes Relative: 35 %
Lymphs Abs: 3.1 10*3/uL (ref 0.7–4.0)
MCH: 30.3 pg (ref 26.0–34.0)
MCHC: 34.1 g/dL (ref 30.0–36.0)
MCV: 88.9 fL (ref 80.0–100.0)
Monocytes Absolute: 0.9 10*3/uL (ref 0.1–1.0)
Monocytes Relative: 10 %
Neutro Abs: 4.6 10*3/uL (ref 1.7–7.7)
Neutrophils Relative %: 51 %
Platelets: 272 10*3/uL (ref 150–400)
RBC: 4.16 MIL/uL (ref 3.87–5.11)
RDW: 12.6 % (ref 11.5–15.5)
WBC: 8.9 10*3/uL (ref 4.0–10.5)
nRBC: 0 % (ref 0.0–0.2)

## 2023-08-06 LAB — BASIC METABOLIC PANEL WITH GFR
Anion gap: 8 (ref 5–15)
BUN: 11 mg/dL (ref 6–20)
CO2: 24 mmol/L (ref 22–32)
Calcium: 8.6 mg/dL — ABNORMAL LOW (ref 8.9–10.3)
Chloride: 105 mmol/L (ref 98–111)
Creatinine, Ser: 0.68 mg/dL (ref 0.44–1.00)
GFR, Estimated: >60 mL/min (ref >60)
Glucose, Bld: 97 mg/dL (ref 70–99)
Potassium: 3.8 mmol/L (ref 3.5–5.1)
Sodium: 137 mmol/L (ref 135–145)

## 2023-08-06 LAB — TROPONIN I (HIGH SENSITIVITY): Troponin I (High Sensitivity): <2 ng/L (ref <18)

## 2023-08-06 LAB — RESP PANEL BY RT-PCR (RSV, FLU A&B, COVID)  RVPGX2
Influenza A by PCR: NEGATIVE
Influenza B by PCR: NEGATIVE
Resp Syncytial Virus by PCR: NEGATIVE
SARS Coronavirus 2 by RT PCR: NEGATIVE

## 2023-08-06 NOTE — ED Triage Notes (Signed)
 Cough, dry x 1 week. CP and SOB today

## 2023-08-07 ENCOUNTER — Emergency Department (HOSPITAL_BASED_OUTPATIENT_CLINIC_OR_DEPARTMENT_OTHER)

## 2023-08-07 ENCOUNTER — Emergency Department (HOSPITAL_BASED_OUTPATIENT_CLINIC_OR_DEPARTMENT_OTHER)
Admission: EM | Admit: 2023-08-07 | Discharge: 2023-08-07 | Disposition: A | Discharge status: Home / Self Care | Attending: Emergency Medicine | Admitting: Emergency Medicine

## 2023-08-07 DIAGNOSIS — J069 Acute upper respiratory infection, unspecified: Secondary | ICD-10-CM | POA: Diagnosis not present

## 2023-08-07 DIAGNOSIS — R0789 Other chest pain: Secondary | ICD-10-CM

## 2023-08-07 LAB — HCG, SERUM, QUALITATIVE: Preg, Serum: NEGATIVE

## 2023-08-07 MED ORDER — IOHEXOL 350 MG/ML SOLN
75.0000 mL | Freq: Once | INTRAVENOUS | Status: AC | PRN
  Administered 2023-08-07: 75 mL via INTRAVENOUS

## 2023-08-07 MED ORDER — KETOROLAC TROMETHAMINE 30 MG/ML IJ SOLN
15.0000 mg | Freq: Once | INTRAMUSCULAR | Status: AC
  Administered 2023-08-07: 15 mg via INTRAVENOUS
  Filled 2023-08-07: qty 1

## 2023-08-07 MED ORDER — IBUPROFEN 800 MG PO TABS: 800.0000 mg | ORAL_TABLET | Freq: Three times a day (TID) | ORAL | 0 refills | Status: AC

## 2023-08-07 NOTE — ED Provider Notes (Addendum)
 [place] [facility] Provider Note   CSN: [id number] Arrival date & time: 08/06/23  2251     History  Chief Complaint  Patient presents with   Cough    Chest pain    Nicole Molina is a 43 y.o. female.  The history is provided by the patient.  Cough Cough characteristics:  Non-productive Severity:  Moderate Onset quality:  Gradual Duration:  1 week Timing:  Intermittent Progression:  Unchanged Chronicity:  New Context: upper respiratory infection   Relieved by:  Nothing Worsened by:  Nothing Ineffective treatments: promethazine cough syrup. Associated symptoms: no fever, no sore throat and no wheezing   Associated symptoms comment:  Pain with coughing x 1 days  Risk factors: no chemical exposure        Home Medications Prior to Admission medications   Medication Sig Start Date End Date Taking? Authorizing Provider  ibuprofen (ADVIL) 800 MG tablet Take 1 tablet (800 mg total) by mouth 3 (three) times daily. 08/07/23  Yes Nayeliz Hipp, MD  Cholecalciferol (VITAMIN D3) 1.25 MG (50000 UT) CAPS Take by mouth.    [provider]  cyclobenzaprine (FLEXERIL) 10 MG tablet Take 10 mg by mouth.    [provider]  levonorgestrel (MIRENA) 20 MCG/24HR IUD 1 each by Intrauterine route once.    [provider]  morphine (MSIR) 15 MG tablet Take 0.5 tablets (7.5 mg total) by mouth every 4 (four) hours as needed for severe pain. 01/29/23   Melene Plan, DO  omeprazole (PRILOSEC) 20 MG capsule Take 20 mg by mouth daily.    [provider]  ondansetron (ZOFRAN) 4 MG tablet Take 1 tablet (4 mg total) by mouth every 6 (six) hours. 02/13/21   Curatolo, Adam, DO  ondansetron (ZOFRAN-ODT) 4 MG disintegrating tablet 4mg  ODT q4 hours prn nausea/vomit 01/29/23   Melene Plan, DO  oxyCODONE-acetaminophen (PERCOCET) 10-325 MG tablet Take 1 tablet by mouth every 6 (six) hours as needed for pain. 02/20/22   Molpus, John, MD   pantoprazole (PROTONIX) 40 MG tablet Take 40 mg by mouth daily.    [provider]  tamsulosin (FLOMAX) 0.4 MG CAPS capsule Take 1 capsule (0.4 mg total) by mouth daily after supper. 01/29/23   Melene Plan, DO  zolpidem (AMBIEN) 10 MG tablet Take 10 mg by mouth at bedtime as needed for sleep.    [provider]      Allergies    Patient has no known allergies.    Review of Systems   Review of Systems  Constitutional:  Negative for fever.  HENT:  Negative for dental problem and sore throat.   Eyes:  Negative for redness.  Respiratory:  Positive for cough. Negative for wheezing and stridor.   Cardiovascular:  Negative for palpitations and leg swelling.  All other systems reviewed and are negative.   Physical Exam Updated Vital Signs BP 105/86   Pulse 96   Temp 98.8 F (37.1 C)   Resp 18   Ht 5' (1.524 m)   Wt 81.6 kg   LMP 07/19/2023   SpO2 98%   BMI 35.15 kg/m  Physical Exam Vitals and nursing note reviewed.  Constitutional:      General: She is not in acute distress.    Appearance: Normal appearance. She is well-developed.  HENT:     Head: Normocephalic and atraumatic.     Nose: Nose normal.  Eyes:     Pupils: Pupils are equal, round, and reactive  to light.  Cardiovascular:     Rate and Rhythm: Normal rate and regular rhythm.     Pulses: Normal pulses.     Heart sounds: Normal heart sounds.  Pulmonary:     Effort: Pulmonary effort is normal. No respiratory distress.     Breath sounds: Normal breath sounds.  Chest:     Chest wall: Tenderness present.  Abdominal:     General: Bowel sounds are normal. There is no distension.     Palpations: Abdomen is soft.     Tenderness: There is no abdominal tenderness. There is no guarding or rebound.  Musculoskeletal:        General: Normal range of motion.     Cervical back: Neck supple.  Skin:    General: Skin is warm and dry.     Capillary Refill: Capillary refill takes less than 2 seconds.      Findings: No erythema or rash.  Neurological:     General: No focal deficit present.     Mental Status: She is alert and oriented to person, place, and time.     Deep Tendon Reflexes: Reflexes normal.  Psychiatric:        Mood and Affect: Mood normal.     ED Results / Procedures / Treatments   Labs (all labs ordered are listed, but only abnormal results are displayed) Results for orders placed or performed during the hospital encounter of 08/07/23  Resp panel by RT-PCR (RSV, Flu A&B, Covid) Anterior Nasal Swab   Collection Time: 08/06/23 11:03 PM   Specimen: Anterior Nasal Swab  Result Value Ref Range   SARS Coronavirus 2 by RT PCR NEGATIVE NEGATIVE   Influenza A by PCR NEGATIVE NEGATIVE   Influenza B by PCR NEGATIVE NEGATIVE   Resp Syncytial Virus by PCR NEGATIVE NEGATIVE  Basic metabolic panel   Collection Time: 08/06/23 11:03 PM  Result Value Ref Range   Sodium 137 135 - 145 mmol/L   Potassium 3.8 3.5 - 5.1 mmol/L   Chloride 105 98 - 111 mmol/L   CO2 24 22 - 32 mmol/L   Glucose, Bld 97 70 - 99 mg/dL   BUN 11 6 - 20 mg/dL   Creatinine, Ser 5.40 0.44 - 1.00 mg/dL   Calcium 8.6 (L) 8.9 - 10.3 mg/dL   GFR, Estimated >98 >11 mL/min   Anion gap 8 5 - 15  CBC with Differential   Collection Time: 08/06/23 11:03 PM  Result Value Ref Range   WBC 8.9 4.0 - 10.5 K/uL   RBC 4.16 3.87 - 5.11 MIL/uL   Hemoglobin 12.6 12.0 - 15.0 g/dL   HCT 91.4 78.2 - 95.6 %   MCV 88.9 80.0 - 100.0 fL   MCH 30.3 26.0 - 34.0 pg   MCHC 34.1 30.0 - 36.0 g/dL   RDW 21.3 08.6 - 57.8 %   Platelets 272 150 - 400 K/uL   nRBC 0.0 0.0 - 0.2 %   Neutrophils Relative % 51 %   Neutro Abs 4.6 1.7 - 7.7 K/uL   Lymphocytes Relative 35 %   Lymphs Abs 3.1 0.7 - 4.0 K/uL   Monocytes Relative 10 %   Monocytes Absolute 0.9 0.1 - 1.0 K/uL   Eosinophils Relative 3 %   Eosinophils Absolute 0.2 0.0 - 0.5 K/uL   Basophils Relative 1 %   Basophils Absolute 0.1 0.0 - 0.1 K/uL   Immature Granulocytes 0 %   Abs  Immature Granulocytes 0.03 0.00 - 0.07 K/uL  Troponin  I (High Sensitivity)   Collection Time: 08/06/23 11:03 PM  Result Value Ref Range   Troponin I (High Sensitivity) <2 <18 ng/L  hCG, serum, qualitative   Collection Time: 08/07/23 12:18 AM  Result Value Ref Range   Preg, Serum NEGATIVE NEGATIVE   CT Angio Chest PE W and/or Wo Contrast Result Date: 08/07/2023 CLINICAL DATA:  Syncope/presyncope, cerebrovascular cause suspected. Cough for 1 week and shortness of breath today. EXAM: CT ANGIOGRAPHY CHEST WITH CONTRAST TECHNIQUE: Multidetector CT imaging of the chest was performed using the standard protocol during bolus administration of intravenous contrast. Multiplanar CT image reconstructions and MIPs were obtained to evaluate the vascular anatomy. RADIATION DOSE REDUCTION: This exam was performed according to the departmental dose-optimization program which includes automated exposure control, adjustment of the mA and/or kV according to patient size and/or use of iterative reconstruction technique. CONTRAST:  75mL OMNIPAQUE IOHEXOL 350 MG/ML SOLN COMPARISON:  Radiographs 08/06/2023 and CTA chest 09/08/2022 FINDINGS: Cardiovascular: Normal heart size. No pericardial effusion. Normal caliber thoracic aorta. Negative for acute pulmonary embolism. Mediastinum/Nodes: Trachea and esophagus are unremarkable. No thoracic adenopathy. Lungs/Pleura: Lungs are clear. No pleural effusion or pneumothorax. Upper Abdomen: No acute abnormality. Musculoskeletal: No acute fracture. Review of the MIP images confirms the above findings. IMPRESSION: 1. Negative for acute pulmonary embolism. 2. No acute abnormality in the chest. Electronically Signed   By: Minerva Fester M.D.   On: 08/07/2023 00:50   DG Chest 2 View Result Date: 08/06/2023 CLINICAL DATA:  Shortness of breath, cough EXAM: CHEST - 2 VIEW COMPARISON:  09/08/2022 FINDINGS: The heart size and mediastinal contours are within normal limits. Both lungs are clear.  The visualized skeletal structures are unremarkable. IMPRESSION: No active cardiopulmonary disease. Electronically Signed   By: Charlett Nose M.D.   On: 08/06/2023 23:21    EKG EKG Interpretation Date/Time:  Monday August 06 2023 23:05:14 EDT Ventricular Rate:  97 PR Interval:  132 QRS Duration:  84 QT Interval:  352 QTC Calculation: 447 R Axis:   38  Text Interpretation: Normal sinus rhythm Confirmed by Nicanor Alcon, Gladie Gravette ([id number]) on 08/07/2023 12:13:00 AM  Radiology CT Angio Chest PE W and/or Wo Contrast Result Date: 08/07/2023 CLINICAL DATA:  Syncope/presyncope, cerebrovascular cause suspected. Cough for 1 week and shortness of breath today. EXAM: CT ANGIOGRAPHY CHEST WITH CONTRAST TECHNIQUE: Multidetector CT imaging of the chest was performed using the standard protocol during bolus administration of intravenous contrast. Multiplanar CT image reconstructions and MIPs were obtained to evaluate the vascular anatomy. RADIATION DOSE REDUCTION: This exam was performed according to the departmental dose-optimization program which includes automated exposure control, adjustment of the mA and/or kV according to patient size and/or use of iterative reconstruction technique. CONTRAST:  75mL OMNIPAQUE IOHEXOL 350 MG/ML SOLN COMPARISON:  Radiographs 08/06/2023 and CTA chest 09/08/2022 FINDINGS: Cardiovascular: Normal heart size. No pericardial effusion. Normal caliber thoracic aorta. Negative for acute pulmonary embolism. Mediastinum/Nodes: Trachea and esophagus are unremarkable. No thoracic adenopathy. Lungs/Pleura: Lungs are clear. No pleural effusion or pneumothorax. Upper Abdomen: No acute abnormality. Musculoskeletal: No acute fracture. Review of the MIP images confirms the above findings. IMPRESSION: 1. Negative for acute pulmonary embolism. 2. No acute abnormality in the chest. Electronically Signed   By: Minerva Fester M.D.   On: 08/07/2023 00:50   DG Chest 2 View Result Date: 08/06/2023 CLINICAL DATA:   Shortness of breath, cough EXAM: CHEST - 2 VIEW COMPARISON:  09/08/2022 FINDINGS: The heart size and mediastinal contours are within normal limits. Both lungs are clear.  The visualized skeletal structures are unremarkable. IMPRESSION: No active cardiopulmonary disease. Electronically Signed   By: Charlett Nose M.D.   On: 08/06/2023 23:21    Procedures Procedures    Medications Ordered in ED Medications  iohexol (OMNIPAQUE) 350 MG/ML injection 75 mL (75 mLs Intravenous Contrast Given 08/07/23 0031)  ketorolac (TORADOL) 30 MG/ML injection 15 mg (15 mg Intravenous Given 08/07/23 0056)    ED Course/ Medical Decision Making/ A&P                                 Medical Decision Making Patient with a week of cough not improved on promethazine cough syrup and now with pain with cough.  No fevers.   Amount and/or Complexity of Data Reviewed External Data Reviewed: notes.    Details: Previous notes reviewed Labs: ordered.    Details: Negative covid and flu. Normal electrolyte panel, specifically normal creatinine 0.68, normal troponin < 2, negative pregnancy test.  Normal white count 8.9, normal hemoglobin 12.6, normal platelets  Radiology: ordered and independent interpretation performed.    Details: Negative CTA by me  ECG/medicine tests: ordered and independent interpretation performed. Decision-making details documented in ED Course.  Risk Prescription drug management. Risk Details: Well appearing.  Lungs clear on exam and imaging.  Ruled out for MI based on time course.  Heart score is 0 very low risk for MACE>  ruled out for PE and PNA.  Viral URI with cough with pain with coughing. Tylenol and ibuprofen for pain.  Stable for discharge.  Strict returns.      Final Clinical Impression(s) / ED Diagnoses Final diagnoses:  Viral URI with cough  Chest wall pain    No signs of systemic illness or infection. The patient is nontoxic-appearing on exam and vital signs are within normal limits.   I have reviewed the triage vital signs and the nursing notes. Pertinent labs & imaging results that were available during my care of the patient were reviewed by me and considered in my medical decision making (see chart for details). After history, exam, and medical workup I feel the patient has been appropriately medically screened and is safe for discharge home. Pertinent diagnoses were discussed with the patient. Patient was given return precautions.    Rx / DC Orders ED Discharge Orders          Ordered    ibuprofen (ADVIL) 800 MG tablet  3 times daily        08/07/23 0100              Eliyohu Class, MD 08/07/23 6045

## 2023-08-07 NOTE — ED Notes (Signed)
 Patient transported to CT

## 2023-09-20 DIAGNOSIS — R635 Abnormal weight gain: Secondary | ICD-10-CM | POA: Diagnosis not present

## 2023-10-03 ENCOUNTER — Other Ambulatory Visit: Payer: Self-pay | Admitting: Family Medicine

## 2023-10-03 ENCOUNTER — Ambulatory Visit
Admission: RE | Admit: 2023-10-03 | Discharge: 2023-10-03 | Disposition: A | Discharge status: Home / Self Care | Source: Ambulatory Visit | Attending: Family Medicine | Admitting: Family Medicine

## 2023-10-03 DIAGNOSIS — M545 Low back pain, unspecified: Secondary | ICD-10-CM

## 2024-01-05 ENCOUNTER — Encounter (HOSPITAL_BASED_OUTPATIENT_CLINIC_OR_DEPARTMENT_OTHER): Payer: Self-pay | Admitting: Emergency Medicine

## 2024-01-05 ENCOUNTER — Other Ambulatory Visit: Payer: Self-pay

## 2024-01-05 ENCOUNTER — Emergency Department (HOSPITAL_BASED_OUTPATIENT_CLINIC_OR_DEPARTMENT_OTHER)
Admission: EM | Admit: 2024-01-05 | Discharge: 2024-01-05 | Disposition: A | Discharge status: Home / Self Care | Attending: Emergency Medicine | Admitting: Emergency Medicine

## 2024-01-05 ENCOUNTER — Emergency Department (HOSPITAL_BASED_OUTPATIENT_CLINIC_OR_DEPARTMENT_OTHER)

## 2024-01-05 DIAGNOSIS — J111 Influenza due to unidentified influenza virus with other respiratory manifestations: Secondary | ICD-10-CM | POA: Insufficient documentation

## 2024-01-05 DIAGNOSIS — R059 Cough, unspecified: Secondary | ICD-10-CM | POA: Diagnosis present

## 2024-01-05 LAB — CBC WITH DIFFERENTIAL/PLATELET
Abs Immature Granulocytes: 0.03 K/uL (ref 0.00–0.07)
Basophils Absolute: 0 K/uL (ref 0.0–0.1)
Basophils Relative: 1 %
Eosinophils Absolute: 0.2 K/uL (ref 0.0–0.5)
Eosinophils Relative: 3 %
HCT: 39.1 % (ref 36.0–46.0)
Hemoglobin: 13.4 g/dL (ref 12.0–15.0)
Immature Granulocytes: 0 %
Lymphocytes Relative: 36 %
Lymphs Abs: 2.7 K/uL (ref 0.7–4.0)
MCH: 30.1 pg (ref 26.0–34.0)
MCHC: 34.3 g/dL (ref 30.0–36.0)
MCV: 87.9 fL (ref 80.0–100.0)
Monocytes Absolute: 0.7 K/uL (ref 0.1–1.0)
Monocytes Relative: 10 %
Neutro Abs: 3.7 K/uL (ref 1.7–7.7)
Neutrophils Relative %: 50 %
Platelets: 231 K/uL (ref 150–400)
RBC: 4.45 MIL/uL (ref 3.87–5.11)
RDW: 12.5 % (ref 11.5–15.5)
WBC: 7.4 K/uL (ref 4.0–10.5)
nRBC: 0 % (ref 0.0–0.2)

## 2024-01-05 LAB — COMPREHENSIVE METABOLIC PANEL WITH GFR
ALT: 9 U/L (ref 0–44)
AST: 17 U/L (ref 15–41)
Albumin: 4 g/dL (ref 3.5–5.0)
Alkaline Phosphatase: 55 U/L (ref 38–126)
Anion gap: 11 (ref 5–15)
BUN: 10 mg/dL (ref 6–20)
CO2: 22 mmol/L (ref 22–32)
Calcium: 9 mg/dL (ref 8.9–10.3)
Chloride: 103 mmol/L (ref 98–111)
Creatinine, Ser: 0.68 mg/dL (ref 0.44–1.00)
GFR, Estimated: >60 mL/min (ref >60)
Glucose, Bld: 103 mg/dL — ABNORMAL HIGH (ref 70–99)
Potassium: 3.8 mmol/L (ref 3.5–5.1)
Sodium: 136 mmol/L (ref 135–145)
Total Bilirubin: 0.5 mg/dL (ref 0.0–1.2)
Total Protein: 7.1 g/dL (ref 6.5–8.1)

## 2024-01-05 LAB — RESP PANEL BY RT-PCR (RSV, FLU A&B, COVID)  RVPGX2
Influenza A by PCR: NEGATIVE
Influenza B by PCR: NEGATIVE
Resp Syncytial Virus by PCR: NEGATIVE
SARS Coronavirus 2 by RT PCR: NEGATIVE

## 2024-01-05 LAB — HCG, SERUM, QUALITATIVE: Preg, Serum: NEGATIVE

## 2024-01-05 LAB — LIPASE, BLOOD: Lipase: 21 U/L (ref 11–51)

## 2024-01-05 MED ORDER — ONDANSETRON HCL 4 MG/2ML IJ SOLN
4.0000 mg | Freq: Once | INTRAMUSCULAR | Status: AC
  Administered 2024-01-05: 4 mg via INTRAVENOUS
  Filled 2024-01-05: qty 2

## 2024-01-05 MED ORDER — SODIUM CHLORIDE 0.9 % IV BOLUS
1000.0000 mL | Freq: Once | INTRAVENOUS | Status: AC
  Administered 2024-01-05: 1000 mL via INTRAVENOUS

## 2024-01-05 MED ORDER — BENZONATATE 100 MG PO CAPS: 100.0000 mg | ORAL_CAPSULE | Freq: Three times a day (TID) | ORAL | 0 refills | Status: AC

## 2024-01-05 MED ORDER — KETOROLAC TROMETHAMINE 15 MG/ML IJ SOLN
15.0000 mg | Freq: Once | INTRAMUSCULAR | Status: AC
  Administered 2024-01-05: 15 mg via INTRAVENOUS
  Filled 2024-01-05: qty 1

## 2024-01-05 MED ORDER — BENZONATATE 100 MG PO CAPS: 100.0000 mg | ORAL_CAPSULE | Freq: Three times a day (TID) | ORAL | 0 refills | Status: DC

## 2024-01-05 MED ORDER — ONDANSETRON 4 MG PO TBDP: ORAL_TABLET | ORAL | 0 refills | Status: AC

## 2024-01-05 MED ORDER — ONDANSETRON 4 MG PO TBDP: ORAL_TABLET | ORAL | 0 refills | Status: DC

## 2024-01-05 NOTE — Discharge Instructions (Signed)
 Take tylenol 2 pills 4 times a day and motrin 4 pills 3 times a day.  Drink plenty of fluids.  Return for worsening shortness of breath, headache, confusion. Follow up with your family doctor.

## 2024-01-05 NOTE — ED Provider Notes (Signed)
  [facility] AT [facility] Provider Note   CSN: [id number] Arrival date & time: 01/05/24  2116     Patient presents with: Generalized Body Aches   Nicole Molina is a 43 y.o. female.   43 yo F with a chief complaints of cough congestion fevers chills myalgias nausea vomiting going on for about 12 hours now.  She feels like her symptoms are getting worse and does not feel like she can keep anything down.  Came in for evaluation.  No known sick contacts.  Took a home COVID test that was negative.  She is also having right-sided low back pain that radiates down the leg.  Tells me she has never had this before.  Seems to come and go.        Prior to Admission medications   Medication Sig Start Date End Date Taking? Authorizing Provider  benzonatate  (TESSALON ) 100 MG capsule Take 1 capsule (100 mg total) by mouth every 8 (eight) hours. 01/05/24  Yes Emil Share, DO  ondansetron  (ZOFRAN -ODT) 4 MG disintegrating tablet 4mg  ODT q4 hours prn nausea/vomit 01/05/24  Yes Bader Stubblefield, DO  Cholecalciferol (VITAMIN D3) 1.25 MG (50000 UT) CAPS Take by mouth.    [provider]  cyclobenzaprine (FLEXERIL) 10 MG tablet Take 10 mg by mouth.    [provider]  ibuprofen  (ADVIL ) 800 MG tablet Take 1 tablet (800 mg total) by mouth 3 (three) times daily. 08/07/23   Palumbo, April, MD  levonorgestrel (MIRENA) 20 MCG/24HR IUD 1 each by Intrauterine route once.    [provider]  morphine  (MSIR) 15 MG tablet Take 0.5 tablets (7.5 mg total) by mouth every 4 (four) hours as needed for severe pain. 01/29/23   Kiante Petrovich, DO  omeprazole (PRILOSEC) 20 MG capsule Take 20 mg by mouth daily.    [provider]  ondansetron  (ZOFRAN ) 4 MG tablet Take 1 tablet (4 mg total) by mouth every 6 (six) hours. 02/13/21   Curatolo, Adam, DO  oxyCODONE -acetaminophen  (PERCOCET) 10-325 MG tablet Take 1 tablet by mouth every 6 (six) hours as needed for pain. 02/20/22    Molpus, John, MD  pantoprazole (PROTONIX) 40 MG tablet Take 40 mg by mouth daily.    [provider]  tamsulosin  (FLOMAX ) 0.4 MG CAPS capsule Take 1 capsule (0.4 mg total) by mouth daily after supper. 01/29/23   Emil Share, DO  zolpidem  (AMBIEN ) 10 MG tablet Take 10 mg by mouth at bedtime as needed for sleep.    [provider]    Allergies: Patient has no known allergies.    Review of Systems  Updated Vital Signs BP 118/79 (BP Location: Right Arm)   Pulse 95   Temp 98 F (36.7 C)   Resp 20   Ht 5' (1.524 m)   Wt 79.4 kg   SpO2 100%   BMI 34.18 kg/m   Physical Exam Vitals and nursing note reviewed.  Constitutional:      General: She is not in acute distress.    Appearance: She is well-developed. She is not diaphoretic.  HENT:     Head: Normocephalic and atraumatic.     Comments: Swollen turbinates, posterior nasal drip, tm normal bilaterally.   Eyes:     Pupils: Pupils are equal, round, and reactive to light.  Cardiovascular:     Rate and Rhythm: Normal rate and regular rhythm.     Heart sounds: No murmur heard.    No friction rub. No gallop.  Pulmonary:     Effort: Pulmonary effort is normal.     Breath sounds: No wheezing or rales.  Abdominal:     General: There is no distension.     Palpations: Abdomen is soft.     Tenderness: There is no abdominal tenderness.  Musculoskeletal:        General: No tenderness.     Cervical back: Normal range of motion and neck supple.     Comments: Pulse motor and sensation intact to the right lower extremity.    Skin:    General: Skin is warm and dry.  Neurological:     Mental Status: She is alert and oriented to person, place, and time.  Psychiatric:        Behavior: Behavior normal.     (all labs ordered are listed, but only abnormal results are displayed) Labs Reviewed  COMPREHENSIVE METABOLIC PANEL WITH GFR - Abnormal; Notable for the following components:      Result Value   Glucose, Bld 103 (*)     All other components within normal limits  RESP PANEL BY RT-PCR (RSV, FLU A&B, COVID)  [id number]  CBC WITH DIFFERENTIAL/PLATELET  LIPASE, BLOOD  HCG, SERUM, QUALITATIVE    EKG: None  Radiology: DG Chest Port 1 View Result Date: 01/05/2024 CLINICAL DATA:  Cough and fever EXAM: PORTABLE CHEST 1 VIEW COMPARISON:  Chest x-ray 08/06/2023 FINDINGS: The heart size and mediastinal contours are within normal limits. Both lungs are clear. The visualized skeletal structures are unremarkable. IMPRESSION: No active disease. Electronically Signed   By: Greig Pique M.D.   On: 01/05/2024 21:58     Procedures   Medications Ordered in the ED  sodium chloride  0.9 % bolus 1,000 mL (1,000 mLs Intravenous New Bag/Given 01/05/24 2150)  ondansetron  (ZOFRAN ) injection 4 mg (4 mg Intravenous Given 01/05/24 2150)  ketorolac  (TORADOL ) 15 MG/ML injection 15 mg (15 mg Intravenous Given 01/05/24 2152)                                    Medical Decision Making Amount and/or Complexity of Data Reviewed Labs: ordered. Radiology: ordered.  Risk Prescription drug management.   43 yo F with a chief complaint of cough congestion fevers chills myalgias going on for about 12 hours now.  Will obtain a laboratory evaluation.  Treat symptoms. Viral syndrome by history.  Reassess.  Chest x-ray dependently interpreted by me without focal infiltrate or pneumothorax.  No significant electrolyte abnormalities no leukocytosis no anemia.  COVID flu and RSV are negative.  Will discharge patient home.  Treat his viral syndrome.  PCP follow-up.  10:29 PM:  I have discussed the diagnosis/risks/treatment options with the patient.  Evaluation and diagnostic testing in the emergency department does not suggest an emergent condition requiring admission or immediate intervention beyond what has been performed at this time.  They will follow up with PCP. We also discussed returning to the ED immediately if new or worsening sx  occur. We discussed the sx which are most concerning (e.g., sudden worsening pain, fever, inability to tolerate by mouth) that necessitate immediate return. Medications administered to the patient during their visit and any new prescriptions provided to the patient are listed below.  Medications given during this visit Medications  sodium chloride  0.9 % bolus 1,000 mL (1,000 mLs Intravenous New Bag/Given 01/05/24 2150)  ondansetron  (ZOFRAN ) injection 4 mg (4 mg Intravenous Given 01/05/24 2150)  ketorolac  (TORADOL ) 15 MG/ML injection 15 mg (15 mg Intravenous Given 01/05/24 2152)     The patient appears reasonably screen and/or stabilized for discharge and I doubt any other medical condition or other [facility] requiring further screening, evaluation, or treatment in the ED at this time prior to discharge.        Final diagnoses:  Influenza-like illness    ED Discharge Orders          Ordered    ondansetron  (ZOFRAN -ODT) 4 MG disintegrating tablet        01/05/24 2228    benzonatate  (TESSALON ) 100 MG capsule  Every 8 hours        01/05/24 2228               Emil Share, DO 01/05/24 2229

## 2024-01-05 NOTE — ED Triage Notes (Signed)
 Pt c/o multiple sxs since yesterday including: body aches, fever, back pain with radiation down RLE, SHOB, vomiting
